# Patient Record
Sex: Female | Born: 2005
Health system: Southern US, Community
[De-identification: ages and names within clinical notes are randomized; demographics above are authoritative.]

## PROBLEM LIST (undated history)

## (undated) DIAGNOSIS — R51 Headache: Secondary | ICD-10-CM

## (undated) DIAGNOSIS — R519 Headache, unspecified: Secondary | ICD-10-CM

## (undated) HISTORY — PX: KNEE CARTILAGE SURGERY: SHX688

## (undated) HISTORY — PX: NO PAST SURGERIES: SHX2092

---
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
## 2023-01-28 ENCOUNTER — Encounter (HOSPITAL_COMMUNITY): Payer: Self-pay

## 2023-01-28 ENCOUNTER — Ambulatory Visit (HOSPITAL_COMMUNITY)
Admission: EM | Admit: 2023-01-28 | Discharge: 2023-01-28 | Disposition: A | Discharge status: Home / Self Care | Payer: Medicaid Other | Attending: Physician Assistant | Admitting: Physician Assistant

## 2023-01-28 DIAGNOSIS — N926 Irregular menstruation, unspecified: Secondary | ICD-10-CM | POA: Diagnosis present

## 2023-01-28 DIAGNOSIS — N946 Dysmenorrhea, unspecified: Secondary | ICD-10-CM

## 2023-01-28 LAB — CBC
HCT: 45 % (ref 36.0–46.0)
Hemoglobin: 14.8 g/dL (ref 12.0–15.0)
MCH: 29 pg (ref 26.0–34.0)
MCHC: 32.9 g/dL (ref 30.0–36.0)
MCV: 88.2 fL (ref 80.0–100.0)
Platelets: 328 10*3/uL (ref 150–400)
RBC: 5.1 MIL/uL (ref 3.87–5.11)
RDW: 13.9 % (ref 11.5–15.5)
WBC: 9.6 10*3/uL (ref 4.0–10.5)
nRBC: 0 % (ref 0.0–0.2)

## 2023-01-28 LAB — HCG, QUANTITATIVE, PREGNANCY: hCG, Beta Chain, Quant, S: <1 m[IU]/mL (ref <5)

## 2023-01-28 LAB — POCT URINALYSIS DIP (MANUAL ENTRY)
Bilirubin, UA: NEGATIVE
Glucose, UA: NEGATIVE mg/dL
Leukocytes, UA: NEGATIVE
Nitrite, UA: NEGATIVE
Protein Ur, POC: NEGATIVE mg/dL
Spec Grav, UA: >=1.03 — AB (ref 1.010–1.025)
Urobilinogen, UA: 0.2 E.U./dL
pH, UA: 5.5 (ref 5.0–8.0)

## 2023-01-28 LAB — POCT URINE PREGNANCY: Preg Test, Ur: NEGATIVE

## 2023-01-28 MED ORDER — IBUPROFEN 600 MG PO TABS: 600.0000 mg | ORAL_TABLET | Freq: Three times a day (TID) | ORAL | 0 refills | Status: DC | PRN

## 2023-01-28 NOTE — ED Triage Notes (Signed)
Onset 1-2 months Patient having nausea. 1-2 weeks ago 2 home pregnancy tests were positive. Last night onset of severe cramps. Onset this morning having vaginal bleeding and blood clots.   No known falls or injuries. No new medications. Not on birth control, stopped 3 months ago.

## 2023-01-28 NOTE — ED Provider Notes (Signed)
[facility]    CSN: [id number] Arrival date & time: 01/28/23  1646      History   Chief Complaint Chief Complaint  Patient presents with   Possible Pregnancy    Entered by patient    HPI Dawn Sullivan is a 17 y.o. female.   Patient presents today with a 24-hour history of lower abdominal cramping and vaginal bleeding.  Reports that she has a history of irregular menstrual cycles and her last menstrual cycle was October 2023.  She had been getting Depo-Provera beginning January of this year but was post to have her second dose in April and missed this.  She is no longer on any kind of contraception.  She did take a home pregnancy test a few weeks ago that were positive.  She has not established with OB/GYN or had any kind of additional testing.  She reports that this morning she developed significant lower abdominal cramping and dark discharge..  She denies any fever, nausea, vomiting, severe pelvic pain, abnormal vaginal discharge, specific concern for STI.  She denies any recent medication changes.  She reports that pain is rated 6 on a 0-10 pain scale, localized to lower abdomen, described as cramping, no aggravating alleviating factors identified.    Past Medical History:  Diagnosis Date   Headache     Patient Active Problem List   Diagnosis Date Noted   Patellar subluxation, right, subsequent encounter 08/12/2020    Past Surgical History:  Procedure Laterality Date   KNEE CARTILAGE SURGERY      OB History   No obstetric history on file.      Home Medications    Prior to Admission medications   Medication Sig Start Date End Date Taking? Authorizing Provider  ibuprofen (ADVIL) 600 MG tablet Take 1 tablet (600 mg total) by mouth every 8 (eight) hours as needed. 01/28/23  Yes Lynnita Somma, Noberto Retort, PA-C    Family History Family History  Problem Relation Age of Onset   Kidney disease Mother     Social History Social History   Tobacco Use   Smoking  status: Never   Smokeless tobacco: Never  Vaping Use   Vaping Use: Never used  Substance Use Topics   Alcohol use: No   Drug use: No     Allergies   Amoxicillin   Review of Systems Review of Systems  Constitutional:  Negative for activity change, appetite change, fatigue and fever.  Gastrointestinal:  Positive for abdominal pain. Negative for diarrhea and vomiting.  Genitourinary:  Positive for pelvic pain and vaginal bleeding. Negative for dysuria, frequency, menstrual problem, urgency, vaginal discharge and vaginal pain.     Physical Exam Triage Vital Signs ED Triage Vitals  Enc Vitals Group     BP 01/28/23 1706 118/79     Pulse Rate 01/28/23 1706 89     Resp 01/28/23 1706 18     Temp 01/28/23 1706 98.6 F (37 C)     Temp Source 01/28/23 1706 Oral     SpO2 01/28/23 1706 96 %     Weight 01/28/23 1706 240 lb (108.9 kg)     Height 01/28/23 1706 6\' 2"  (1.88 m)     Head Circumference --      Peak Flow --      Pain Score 01/28/23 1704 6     Pain Loc --      Pain Edu? --      Excl. in GC? --    No data  found.  Updated Vital Signs BP 118/79 (BP Location: Left Arm)   Pulse 89   Temp 98.6 F (37 C) (Oral)   Resp 18   Ht 6\' 2"  (1.88 m)   Wt 240 lb (108.9 kg)   LMP 05/16/2022 (Approximate)   SpO2 96%   BMI 30.81 kg/m   Visual Acuity Right Eye Distance:   Left Eye Distance:   Bilateral Distance:    Right Eye Near:   Left Eye Near:    Bilateral Near:     Physical Exam Vitals reviewed. Exam conducted with a chaperone present.  Constitutional:      General: She is awake. She is not in acute distress.    Appearance: Normal appearance. She is well-developed. She is not ill-appearing.     Comments: Very pleasant female appears stated age in no acute distress sitting comfortably in exam room  HENT:     Head: Normocephalic and atraumatic.  Cardiovascular:     Rate and Rhythm: Normal rate and regular rhythm.     Heart sounds: Normal heart sounds, S1 normal and  S2 normal. No murmur heard. Pulmonary:     Effort: Pulmonary effort is normal.     Breath sounds: Normal breath sounds. No wheezing, rhonchi or rales.     Comments: Clear to auscultation bilaterally Abdominal:     General: Bowel sounds are normal.     Palpations: Abdomen is soft.     Tenderness: There is abdominal tenderness in the suprapubic area. There is no right CVA tenderness, left CVA tenderness, guarding or rebound.  Genitourinary:    Labia:        Right: No rash or tenderness.        Left: No rash or tenderness.      Vagina: Bleeding present.     Cervix: Normal.     Uterus: Normal.      Adnexa: Right adnexa normal and left adnexa normal.     Comments: Percell Belt, CMA present as chaperone during exam.   Psychiatric:        Behavior: Behavior is cooperative.      UC Treatments / Results  Labs (all labs ordered are listed, but only abnormal results are displayed) Labs Reviewed  POCT URINALYSIS DIP (MANUAL ENTRY) - Abnormal; Notable for the following components:      Result Value   Ketones, POC UA trace (5) (*)    Spec Grav, UA >=1.030 (*)    Blood, UA large (*)    All other components within normal limits  CBC  HCG, QUANTITATIVE, PREGNANCY  POCT URINE PREGNANCY    EKG   Radiology No results found.  Procedures Procedures (including critical care time)  Medications Ordered in UC Medications - No data to display  Initial Impression / Assessment and Plan / UC Course  I have reviewed the triage vital signs and the nursing notes.  Pertinent labs & imaging results that were available during my care of the patient were reviewed by me and considered in my medical decision making (see chart for details).     Patient is well-appearing, afebrile, nontoxic, nontachycardic.  Urine pregnancy was negative in clinic today.  UA showed blood but was otherwise normal with no evidence of infection.  Blood is likely related to menstrual bleeding.  Attempted pelvic exam but  patient had difficulty tolerating speculum though I was able to visualize the cervix and posterior vaginal vault with obvious menstrual bleeding.  She did not have any significant tenderness on bimanual  exam.  STI swab was collected and is pending.  Discussed that her symptoms are likely related to starting her menstrual cycle.  She did request that we obtain blood work to rule out pregnancy and so blood hCG was obtained and is pending.  Discussed that she should not start ibuprofen that was prescribed until she receives the result and it is negative.  Assuming that it is negative she can start ibuprofen 600 mg to help with bleeding and cramping.  Recommend that she follow-up with OB/GYN for further evaluation and management.  We discussed that if she has any worsening symptoms including pelvic pain, fever, abdominal pain, nausea, vomiting, abnormal discharge, heavy menstrual bleeding she needs to be seen immediately.  Strict return precautions given.  All questions answered to patient satisfaction.  Final Clinical Impressions(s) / UC Diagnoses   Final diagnoses:  Dysmenorrhea  Irregular menstrual bleeding     Discharge Instructions      Your urine pregnancy test was negative here.  We are drawing your blood work to make sure that you are not pregnant as you requested.  Please do not start any medications until you receive this result.  Assuming that this is negative you can take ibuprofen 600 mg up to 3 times a day for pain relief.  Follow-up with OB/GYN.  If you have any abnormal symptoms including heavy bleeding, increasing pain, bleeding so heavily that you have to change her pad or tampon every hour for more than 3 to 4 hours, chest pain, shortness of breath you need to be seen immediately.     ED Prescriptions     Medication Sig Dispense Auth. Provider   ibuprofen (ADVIL) 600 MG tablet Take 1 tablet (600 mg total) by mouth every 8 (eight) hours as needed. 30 tablet Nyair Depaulo, Noberto Retort, PA-C       PDMP not reviewed this encounter.   Jeani Hawking, PA-C 01/28/23 1750

## 2023-01-28 NOTE — Discharge Instructions (Signed)
Your urine pregnancy test was negative here.  We are drawing your blood work to make sure that you are not pregnant as you requested.  Please do not start any medications until you receive this result.  Assuming that this is negative you can take ibuprofen 600 mg up to 3 times a day for pain relief.  Follow-up with OB/GYN.  If you have any abnormal symptoms including heavy bleeding, increasing pain, bleeding so heavily that you have to change her pad or tampon every hour for more than 3 to 4 hours, chest pain, shortness of breath you need to be seen immediately.

## 2023-05-13 ENCOUNTER — Ambulatory Visit (HOSPITAL_COMMUNITY)
Admission: EM | Admit: 2023-05-13 | Discharge: 2023-05-13 | Disposition: A | Discharge status: Home / Self Care | Payer: Medicaid Other | Attending: Emergency Medicine | Admitting: Emergency Medicine

## 2023-05-13 ENCOUNTER — Encounter (HOSPITAL_COMMUNITY): Payer: Self-pay

## 2023-05-13 ENCOUNTER — Ambulatory Visit (HOSPITAL_COMMUNITY): Payer: Medicaid Other

## 2023-05-13 DIAGNOSIS — R0981 Nasal congestion: Secondary | ICD-10-CM | POA: Diagnosis present

## 2023-05-13 DIAGNOSIS — Z20822 Contact with and (suspected) exposure to covid-19: Secondary | ICD-10-CM | POA: Insufficient documentation

## 2023-05-13 MED ORDER — GUAIFENESIN ER 600 MG PO TB12: 1200.0000 mg | ORAL_TABLET | Freq: Two times a day (BID) | ORAL | 0 refills | Status: AC

## 2023-05-13 NOTE — ED Triage Notes (Signed)
Pt states that she has some nasal congestion and sneezing. X2 days  Pt states that she was exposed to COVID.

## 2023-05-13 NOTE — Discharge Instructions (Addendum)
We have tested you for COVID-19 and your results will be available tomorrow in your MyChart.  Please ensure you are drinking at least 64 ounces of water daily in addition to the 1200 mg of Mucinex.  This will help loosen up your secretions.  You can also sleep with a humidifier.  This appears viral in nature, symptoms should improve over the next 3 to 5 days.    Return to clinic if you have any sinus pain, pressure, fevers or symptoms last beyond 7 to 10 days.

## 2023-05-13 NOTE — ED Provider Notes (Signed)
[facility]    CSN: [id number] Arrival date & time: 05/13/23  1728      History   Chief Complaint Chief Complaint  Patient presents with   Nasal Congestion    Nasal congestion x2 days    HPI Dawn Sullivan is a 17 y.o. female.   Patient presents to clinic complaining of nasal congestion for the past 2 days.  Her mother tested positive for COVID-19 in the emergency department last night.  Patient denies any other symptoms of cough, sore throat, nausea, emesis, abdominal pain or diarrhea. \  She has tried an OTC nasal spray, unsure of what kind.     The history is provided by the patient and medical records.    Past Medical History:  Diagnosis Date   Headache     Patient Active Problem List   Diagnosis Date Noted   Patellar subluxation, right, subsequent encounter 08/12/2020    Past Surgical History:  Procedure Laterality Date   KNEE CARTILAGE SURGERY      OB History   No obstetric history on file.      Home Medications    Prior to Admission medications   Medication Sig Start Date End Date Taking? Authorizing Provider  DEPO-PROVERA 150 MG/ML SUSY SMARTSIG:150 Milligram(s) IM 04/08/23  Yes [provider]  guaiFENesin (MUCINEX) 600 MG 12 hr tablet Take 2 tablets (1,200 mg total) by mouth 2 (two) times daily for 7 days. 05/13/23 05/20/23 Yes Rinaldo Ratel, [place] N, FNP  ibuprofen (ADVIL) 600 MG tablet Take 1 tablet (600 mg total) by mouth every 8 (eight) hours as needed. 01/28/23   Raspet, Noberto Retort, PA-C    Family History Family History  Problem Relation Age of Onset   Kidney disease Mother     Social History Social History   Tobacco Use   Smoking status: Never   Smokeless tobacco: Never  Vaping Use   Vaping status: Never Used  Substance Use Topics   Alcohol use: No   Drug use: No     Allergies   Amoxicillin   Review of Systems Review of Systems  Constitutional:  Negative for appetite change and fever.  HENT:  Positive for  congestion, postnasal drip, rhinorrhea and sinus pressure. Negative for sore throat.   Respiratory:  Negative for cough and shortness of breath.   Gastrointestinal:  Negative for abdominal pain, diarrhea, nausea and vomiting.     Physical Exam Triage Vital Signs ED Triage Vitals  Encounter Vitals Group     BP 05/13/23 1758 138/89     Systolic BP Percentile --      Diastolic BP Percentile --      Pulse Rate 05/13/23 1758 77     Resp 05/13/23 1758 18     Temp 05/13/23 1758 98 F (36.7 C)     Temp Source 05/13/23 1758 Oral     SpO2 05/13/23 1758 97 %     Weight --      Height 05/13/23 1757 6\' 2"  (1.88 m)     Head Circumference --      Peak Flow --      Pain Score 05/13/23 1757 0     Pain Loc --      Pain Education --      Exclude from Growth Chart --    No data found.  Updated Vital Signs BP 138/89 (BP Location: Right Arm)   Pulse 77   Temp 98 F (36.7 C) (Oral)   Resp 18  Ht 6\' 2"  (1.88 m)   SpO2 97%   BMI 30.81 kg/m   Visual Acuity Right Eye Distance:   Left Eye Distance:   Bilateral Distance:    Right Eye Near:   Left Eye Near:    Bilateral Near:     Physical Exam Vitals and nursing note reviewed.  Constitutional:      Appearance: Normal appearance.  HENT:     Head: Normocephalic and atraumatic.     Right Ear: External ear normal.     Left Ear: External ear normal.     Nose: Congestion and rhinorrhea present.     Mouth/Throat:     Mouth: Mucous membranes are moist.     Pharynx: Posterior oropharyngeal erythema present.  Eyes:     Conjunctiva/sclera: Conjunctivae normal.  Cardiovascular:     Rate and Rhythm: Normal rate and regular rhythm.     Heart sounds: Normal heart sounds. No murmur heard. Pulmonary:     Effort: Pulmonary effort is normal. No respiratory distress.     Breath sounds: Normal breath sounds.  Musculoskeletal:        General: Normal range of motion.  Skin:    General: Skin is warm and dry.  Neurological:     General: No focal  deficit present.     Mental Status: She is alert and oriented to person, place, and time.  Psychiatric:        Mood and Affect: Mood normal.        Behavior: Behavior normal.      UC Treatments / Results  Labs (all labs ordered are listed, but only abnormal results are displayed) Labs Reviewed  SARS CORONAVIRUS 2 (TAT 6-24 HRS)    EKG   Radiology No results found.  Procedures Procedures (including critical care time)  Medications Ordered in UC Medications - No data to display  Initial Impression / Assessment and Plan / UC Course  I have reviewed the triage vital signs and the nursing notes.  Pertinent labs & imaging results that were available during my care of the patient were reviewed by me and considered in my medical decision making (see chart for details).  Vitals and triage reviewed, patient is hemodynamically stable.  Lungs are vesicular, heart with regular rate and rhythm.  Rhinorrhea, postnasal drip and posterior pharynx erythema present on exam.  Suspect viral etiology, COVID-19 testing obtained.  Symptomatic management discussed.  Plan of care, follow-up care and return precautions given, no questions at this time.     Final Clinical Impressions(s) / UC Diagnoses   Final diagnoses:  Nasal congestion  Exposure to COVID-19 virus     Discharge Instructions      We have tested you for COVID-19 and your results will be available tomorrow in your MyChart.  Please ensure you are drinking at least 64 ounces of water daily in addition to the 1200 mg of Mucinex.  This will help loosen up your secretions.  You can also sleep with a humidifier.  This appears viral in nature, symptoms should improve over the next 3 to 5 days.    Return to clinic if you have any sinus pain, pressure, fevers or symptoms last beyond 7 to 10 days.      ED Prescriptions     Medication Sig Dispense Auth. Provider   guaiFENesin (MUCINEX) 600 MG 12 hr tablet Take 2 tablets (1,200 mg  total) by mouth 2 (two) times daily for 7 days. 28 tablet Cloy Cozzens, [place] N, [place]  PDMP not reviewed this encounter.   Sahvanna Mcmanigal, [place] N, [place] 05/13/23 [phone]

## 2023-05-14 LAB — SARS CORONAVIRUS 2 (TAT 6-24 HRS): SARS Coronavirus 2: POSITIVE — AB

## 2023-06-16 ENCOUNTER — Ambulatory Visit (HOSPITAL_COMMUNITY)
Admission: EM | Admit: 2023-06-16 | Discharge: 2023-06-16 | Disposition: A | Discharge status: Home / Self Care | Payer: Medicaid Other | Attending: Internal Medicine | Admitting: Internal Medicine

## 2023-06-16 ENCOUNTER — Encounter (HOSPITAL_COMMUNITY): Payer: Self-pay | Admitting: Emergency Medicine

## 2023-06-16 DIAGNOSIS — N898 Other specified noninflammatory disorders of vagina: Secondary | ICD-10-CM | POA: Insufficient documentation

## 2023-06-16 NOTE — ED Triage Notes (Signed)
Patient c/o that she feels "raw" in her vaginal area x 2 days.  Patient denies any vaginal discharge, no concern for STI.  Patient thinks she may have either BV or yeast due to vaginal itching.

## 2023-06-16 NOTE — ED Provider Notes (Signed)
[facility]    CSN: [id number] Arrival date & time: 06/16/23  1129      History   Chief Complaint Chief Complaint  Patient presents with   Exposure to STD    HPI Cherica Verburg is a 17 y.o. female.   Patient presents to urgent care for evaluation of vaginal irritation that started approximately 2 days ago.  Reports significant vaginal itching without vaginal odor or vaginal discharge.  No urinary symptoms, fever, chills, nausea, vomiting, diarrhea, abdominal pain, dizziness, or flank/low back pain.  She has not seen any rash or lesions to the vaginal area and denies concern for STD.  She is sexually active with female partner with protection.  She has not changed any of her personal hygiene products recently and denies recent intake of steroid/antibiotics.  She is currently experiencing withdrawal bleed as she recently started oral contraception and states that the bleeding is very light/spotting.  Has not attempted use of any over-the-counter medications to help with symptoms PTA.   Exposure to STD    Past Medical History:  Diagnosis Date   Headache     Patient Active Problem List   Diagnosis Date Noted   Patellar subluxation, right, subsequent encounter 08/12/2020    Past Surgical History:  Procedure Laterality Date   KNEE CARTILAGE SURGERY      OB History   No obstetric history on file.      Home Medications    Prior to Admission medications   Medication Sig Start Date End Date Taking? Authorizing Provider  DEPO-PROVERA 150 MG/ML SUSY SMARTSIG:150 Milligram(s) IM 04/08/23  Yes [provider]  ibuprofen (ADVIL) 600 MG tablet Take 1 tablet (600 mg total) by mouth every 8 (eight) hours as needed. 01/28/23   Raspet, Noberto Retort, PA-C    Family History Family History  Problem Relation Age of Onset   Kidney disease Mother     Social History Social History   Tobacco Use   Smoking status: Never   Smokeless tobacco: Never  Vaping Use   Vaping  status: Never Used  Substance Use Topics   Alcohol use: No   Drug use: No     Allergies   Amoxicillin   Review of Systems Review of Systems Per HPI  Physical Exam Triage Vital Signs ED Triage Vitals  Encounter Vitals Group     BP 06/16/23 1235 111/76     Systolic BP Percentile --      Diastolic BP Percentile --      Pulse Rate 06/16/23 1235 73     Resp 06/16/23 1235 18     Temp 06/16/23 1235 98.9 F (37.2 C)     Temp Source 06/16/23 1235 Oral     SpO2 06/16/23 1235 97 %     Weight 06/16/23 1238 245 lb (111.1 kg)     Height 06/16/23 1238 6\' 2"  (1.88 m)     Head Circumference --      Peak Flow --      Pain Score 06/16/23 1238 0     Pain Loc --      Pain Education --      Exclude from Growth Chart --    No data found.  Updated Vital Signs BP 111/76 (BP Location: Right Arm)   Pulse 73   Temp 98.9 F (37.2 C) (Oral)   Resp 18   Ht 6\' 2"  (1.88 m)   Wt 245 lb (111.1 kg)   SpO2 97%   BMI 31.46 kg/m  Visual Acuity Right Eye Distance:   Left Eye Distance:   Bilateral Distance:    Right Eye Near:   Left Eye Near:    Bilateral Near:     Physical Exam Vitals and nursing note reviewed.  Constitutional:      Appearance: She is not ill-appearing or toxic-appearing.  HENT:     Head: Normocephalic and atraumatic.     Right Ear: Hearing and external ear normal.     Left Ear: Hearing and external ear normal.     Nose: Nose normal.     Mouth/Throat:     Lips: Pink.  Eyes:     General: Lids are normal. Vision grossly intact. Gaze aligned appropriately.     Extraocular Movements: Extraocular movements intact.     Conjunctiva/sclera: Conjunctivae normal.  Pulmonary:     Effort: Pulmonary effort is normal.  Genitourinary:    Comments: Deferred. Musculoskeletal:     Cervical back: Neck supple.  Skin:    General: Skin is warm and dry.     Capillary Refill: Capillary refill takes less than 2 seconds.     Findings: No rash.  Neurological:     General: No  focal deficit present.     Mental Status: She is alert and oriented to person, place, and time. Mental status is at baseline.     Cranial Nerves: No dysarthria or facial asymmetry.  Psychiatric:        Mood and Affect: Mood normal.        Speech: Speech normal.        Behavior: Behavior normal.        Thought Content: Thought content normal.        Judgment: Judgment normal.      UC Treatments / Results  Labs (all labs ordered are listed, but only abnormal results are displayed) Labs Reviewed  CERVICOVAGINAL ANCILLARY ONLY    EKG   Radiology No results found.  Procedures Procedures (including critical care time)  Medications Ordered in UC Medications - No data to display  Initial Impression / Assessment and Plan / UC Course  I have reviewed the triage vital signs and the nursing notes.  Pertinent labs & imaging results that were available during my care of the patient were reviewed by me and considered in my medical decision making (see chart for details).   Vaginal irritation BV and yeast labs pending, no concern for STI. Will wait to treat once labs return, patient is agreeable with this.  Discussed tips for BV/yeast prevention.  She declines exam today, low suspicion for herpetic lesions/folliculitis.  Counseled patient on potential for adverse effects with medications prescribed/recommended today, strict ER and return-to-clinic precautions discussed, patient verbalized understanding.    Final Clinical Impressions(s) / UC Diagnoses   Final diagnoses:  Vaginal irritation   Discharge Instructions   None    ED Prescriptions   None    PDMP not reviewed this encounter.   Carlisle Beers, [place] 06/16/23 1312

## 2023-06-17 LAB — CERVICOVAGINAL ANCILLARY ONLY
Bacterial Vaginitis (gardnerella): NEGATIVE
Candida Glabrata: NEGATIVE
Candida Vaginitis: POSITIVE — AB
Comment: NEGATIVE
Comment: NEGATIVE
Comment: NEGATIVE

## 2023-06-20 ENCOUNTER — Telehealth (HOSPITAL_COMMUNITY): Payer: Self-pay | Admitting: Family Medicine

## 2023-06-20 ENCOUNTER — Telehealth (HOSPITAL_COMMUNITY): Payer: Self-pay

## 2023-06-20 MED ORDER — FLUCONAZOLE 150 MG PO TABS: 150.0000 mg | ORAL_TABLET | ORAL | 0 refills | Status: DC | PRN

## 2023-06-20 NOTE — Telephone Encounter (Signed)
Diflucan sent to the pharmacy on file.  Patient called and saw her recent vaginal cytology results which was positive for Candida.

## 2023-06-20 NOTE — Telephone Encounter (Signed)
This RN returned pt's phone call at this time. Two pt identifiers utilized. Pt voiced she checked her MyChart and saw she was positive for Candida and calling to request medication be sent to the pharmacy. This RN spoke with Cala Bradford NP, NP to call medication into pt's preferred pharmacy. No other questions/concerns voiced at this time. Pt verbalized understanding.

## 2023-10-23 ENCOUNTER — Encounter (HOSPITAL_COMMUNITY): Payer: Self-pay

## 2023-10-23 ENCOUNTER — Ambulatory Visit (HOSPITAL_COMMUNITY)
Admission: EM | Admit: 2023-10-23 | Discharge: 2023-10-23 | Disposition: A | Discharge status: Home / Self Care | Attending: Physician Assistant | Admitting: Physician Assistant

## 2023-10-23 DIAGNOSIS — Z113 Encounter for screening for infections with a predominantly sexual mode of transmission: Secondary | ICD-10-CM | POA: Insufficient documentation

## 2023-10-23 DIAGNOSIS — R8281 Pyuria: Secondary | ICD-10-CM | POA: Insufficient documentation

## 2023-10-23 DIAGNOSIS — Z3202 Encounter for pregnancy test, result negative: Secondary | ICD-10-CM | POA: Diagnosis not present

## 2023-10-23 LAB — POCT URINALYSIS DIP (MANUAL ENTRY)
Bilirubin, UA: NEGATIVE
Glucose, UA: NEGATIVE mg/dL
Ketones, POC UA: NEGATIVE mg/dL
Nitrite, UA: NEGATIVE
Protein Ur, POC: NEGATIVE mg/dL
Spec Grav, UA: 1.02 (ref 1.010–1.025)
Urobilinogen, UA: 0.2 U/dL
pH, UA: 5.5 (ref 5.0–8.0)

## 2023-10-23 LAB — POCT URINE PREGNANCY: Preg Test, Ur: NEGATIVE

## 2023-10-23 LAB — HIV ANTIBODY (ROUTINE TESTING W REFLEX): HIV Screen 4th Generation wRfx: REACTIVE — AB

## 2023-10-23 NOTE — ED Provider Notes (Signed)
 [facility]    CSN: [id number] Arrival date & time: 10/23/23  1215      History   Chief Complaint Chief Complaint  Patient presents with   std check    HPI Dawn Sullivan is a 18 y.o. female.   HPI   Patient presents today for STD testing. She is sexually active with single female partner She denies current symptoms of vaginal discharge changes, vaginal bleeding,dysuria, fever, chills    Past Medical History:  Diagnosis Date   Headache     Patient Active Problem List   Diagnosis Date Noted   Patellar subluxation, right, subsequent encounter 08/12/2020    Past Surgical History:  Procedure Laterality Date   KNEE CARTILAGE SURGERY      OB History   No obstetric history on file.      Home Medications    Prior to Admission medications   Medication Sig Start Date End Date Taking? Authorizing Provider  DEPO-PROVERA 150 MG/ML SUSY SMARTSIG:150 Milligram(s) IM 04/08/23  Yes [provider]  fluconazole (DIFLUCAN) 150 MG tablet Take 1 tablet (150 mg total) by mouth every three (3) days as needed. Repeat if needed 06/20/23   Bing Neighbors, NP  ibuprofen (ADVIL) 600 MG tablet Take 1 tablet (600 mg total) by mouth every 8 (eight) hours as needed. 01/28/23   Raspet, Noberto Retort, PA-C    Family History Family History  Problem Relation Age of Onset   Kidney disease Mother     Social History Social History   Tobacco Use   Smoking status: Never   Smokeless tobacco: Never  Vaping Use   Vaping status: Never Used  Substance Use Topics   Alcohol use: No   Drug use: No     Allergies   Amoxicillin   Review of Systems Review of Systems  Constitutional:  Negative for chills and fever.  Genitourinary:  Negative for dysuria, flank pain, genital sores, hematuria, vaginal bleeding, vaginal discharge and vaginal pain.  Skin:  Negative for rash.     Physical Exam Triage Vital Signs ED Triage Vitals  Encounter Vitals Group     BP 10/23/23 1233  129/85     Systolic BP Percentile --      Diastolic BP Percentile --      Pulse --      Resp 10/23/23 1233 19     Temp 10/23/23 1233 98.4 F (36.9 C)     Temp Source 10/23/23 1233 Oral     SpO2 10/23/23 1233 97 %     Weight 10/23/23 1232 245 lb (111.1 kg)     Height 10/23/23 1232 6\' 2"  (1.88 m)     Head Circumference --      Peak Flow --      Pain Score 10/23/23 1232 0     Pain Loc --      Pain Education --      Exclude from Growth Chart --    No data found.  Updated Vital Signs BP 129/85 (BP Location: Left Arm)   Temp 98.4 F (36.9 C) (Oral)   Resp 19   Ht 6\' 2"  (1.88 m)   Wt 245 lb (111.1 kg)   SpO2 97%   BMI 31.46 kg/m   Visual Acuity Right Eye Distance:   Left Eye Distance:   Bilateral Distance:    Right Eye Near:   Left Eye Near:    Bilateral Near:     Physical Exam Vitals reviewed.  Constitutional:  General: She is awake.     Appearance: Normal appearance. She is well-developed and well-groomed.  HENT:     Head: Normocephalic and atraumatic.  Eyes:     General: Lids are normal. Gaze aligned appropriately.     Extraocular Movements: Extraocular movements intact.     Conjunctiva/sclera: Conjunctivae normal.  Pulmonary:     Effort: Pulmonary effort is normal.  Neurological:     General: No focal deficit present.     Mental Status: She is alert and oriented to person, place, and time.     GCS: GCS eye subscore is 4. GCS verbal subscore is 5. GCS motor subscore is 6.     Cranial Nerves: No cranial nerve deficit, dysarthria or facial asymmetry.  Psychiatric:        Attention and Perception: Attention and perception normal.        Mood and Affect: Mood and affect normal.        Speech: Speech normal.        Behavior: Behavior normal. Behavior is cooperative.        Thought Content: Thought content normal.        Judgment: Judgment normal.      UC Treatments / Results  Labs (all labs ordered are listed, but only abnormal results are  displayed) Labs Reviewed  POCT URINALYSIS DIP (MANUAL ENTRY) - Abnormal; Notable for the following components:      Result Value   Blood, UA trace-intact (*)    Leukocytes, UA Small (1+) (*)    All other components within normal limits  URINE CULTURE  HIV ANTIBODY (ROUTINE TESTING W REFLEX)  RPR  POCT URINE PREGNANCY  CERVICOVAGINAL ANCILLARY ONLY    EKG   Radiology No results found.  Procedures Procedures (including critical care time)  Medications Ordered in UC Medications - No data to display  Initial Impression / Assessment and Plan / UC Course  I have reviewed the triage vital signs and the nursing notes.  Pertinent labs & imaging results that were available during my care of the patient were reviewed by me and considered in my medical decision making (see chart for details).      Final Clinical Impressions(s) / UC Diagnoses   Final diagnoses:  Screening for STD (sexually transmitted disease)  Pyuria   Patient presents today requesting STD testing.  She denies symptoms of fever, chills, abdominal pain, dysuria, changes to vaginal discharge, vaginal bleeding, pain, genital sores, rashes in the genital region.  Reviewed with her that her urine pregnancy test was negative but urine dip did show trace amount of blood and small leukocytes.  Since she denies current symptoms I am concerned these may be from contamination so reviewed with her that I will send off a sample for culture for definitive rule out.  If antibiotic is indicated by culture results they will be sent in to the pharmacy on file.  Reviewed that cervicovaginal swab results and RPR, HIV testing results will be available in the next few days and she will be contacted with results.  If medications are indicated they will be sent in per protocol.  Recommend condom or other barrier method for protection against STDs.  Recommend refraining from sexual activity until test results are negative or she has completed an  appropriate medication regimen based on results.  Follow-up as needed.    Discharge Instructions      We will keep you updated with the results of your testing.  If any medications are  indicated based on those results they will be sent in to the pharmacy that we have on file.  Please monitor your MyChart and be aware that you may receive a phone call with testing results in the next few days.     ED Prescriptions   None    PDMP not reviewed this encounter.   Kariyah Baugh, Oswaldo Conroy, PA-C 10/23/23 1328

## 2023-10-23 NOTE — Discharge Instructions (Addendum)
 We will keep you updated with the results of your testing.  If any medications are indicated based on those results they will be sent in to the pharmacy that we have on file.  Please monitor your MyChart and be aware that you may receive a phone call with testing results in the next few days.  At this time I recommend that you refrain from sexual activity until your test results are negative.  Please use a condom or another barrier method to prevent STDs and pregnancy

## 2023-10-23 NOTE — ED Triage Notes (Signed)
 Pt states that she wanted to be checked for std. Pt denies any symptoms or exposure.

## 2023-10-24 ENCOUNTER — Telehealth (HOSPITAL_COMMUNITY): Payer: Self-pay

## 2023-10-24 LAB — URINE CULTURE: Culture: NO GROWTH

## 2023-10-24 LAB — CERVICOVAGINAL ANCILLARY ONLY
Bacterial Vaginitis (gardnerella): POSITIVE — AB
Candida Glabrata: NEGATIVE
Candida Vaginitis: NEGATIVE
Chlamydia: NEGATIVE
Comment: NEGATIVE
Comment: NEGATIVE
Comment: NEGATIVE
Comment: NEGATIVE
Comment: NEGATIVE
Comment: NORMAL
Neisseria Gonorrhea: NEGATIVE
Trichomonas: NEGATIVE

## 2023-10-24 LAB — RPR: RPR Ser Ql: NONREACTIVE

## 2023-10-24 NOTE — Telephone Encounter (Signed)
 Pt called requesting lab results, pt advised to call back tomorrow because we are still awaiting more labs to be resulted. Pt wanted to let us know that Fluconazole did not work for her when prev prescribed.

## 2023-10-28 MED ORDER — CLINDAMYCIN PHOSPHATE 2 % VA CREA: 1.0000 | TOPICAL_CREAM | Freq: Every day | VAGINAL | 0 refills | Status: AC

## 2023-10-28 NOTE — Telephone Encounter (Signed)
 Noted. Patient was informed previously via phone that RX would be sent to her preferred pharmacy. Nothing further needed at this time. Encounter signed and closed.

## 2023-10-28 NOTE — Telephone Encounter (Signed)
 Rx for clindamycin vag cream sent to pharmacy

## 2023-10-28 NOTE — Telephone Encounter (Signed)
 Patient calling in and states that she is currently having vaginal symptoms. Saw on mychart that she tested positive for BV from her 10/24/23 visit.   Patient states she is allergic to Amoxicillin and that the last time she took Flagyl I did not help her. Requesting a different type of antibiotic be called in for her to [facility].   Patient informed message routed to the provider on call and RX will be sent in the next hour. Patient verbalized understanding.

## 2023-10-29 ENCOUNTER — Emergency Department (HOSPITAL_COMMUNITY)
Admission: EM | Admit: 2023-10-29 | Discharge: 2023-10-29 | Disposition: A | Discharge status: Home / Self Care | Attending: Emergency Medicine | Admitting: Emergency Medicine

## 2023-10-29 ENCOUNTER — Encounter (HOSPITAL_COMMUNITY): Payer: Self-pay | Admitting: Emergency Medicine

## 2023-10-29 ENCOUNTER — Other Ambulatory Visit: Payer: Self-pay

## 2023-10-29 DIAGNOSIS — R519 Headache, unspecified: Secondary | ICD-10-CM | POA: Diagnosis present

## 2023-10-29 DIAGNOSIS — G43809 Other migraine, not intractable, without status migrainosus: Secondary | ICD-10-CM | POA: Diagnosis not present

## 2023-10-29 DIAGNOSIS — B9689 Other specified bacterial agents as the cause of diseases classified elsewhere: Secondary | ICD-10-CM | POA: Insufficient documentation

## 2023-10-29 DIAGNOSIS — N760 Acute vaginitis: Secondary | ICD-10-CM | POA: Insufficient documentation

## 2023-10-29 LAB — URINALYSIS, ROUTINE W REFLEX MICROSCOPIC
Bilirubin Urine: NEGATIVE
Glucose, UA: NEGATIVE mg/dL
Hgb urine dipstick: NEGATIVE
Ketones, ur: NEGATIVE mg/dL
Nitrite: NEGATIVE
Protein, ur: NEGATIVE mg/dL
Specific Gravity, Urine: 1.013 (ref 1.005–1.030)
pH: 6 (ref 5.0–8.0)

## 2023-10-29 LAB — HIV ANTIBODY (ROUTINE TESTING W REFLEX): HIV Screen 4th Generation wRfx: REACTIVE — AB

## 2023-10-29 LAB — HCG, SERUM, QUALITATIVE: Preg, Serum: NEGATIVE

## 2023-10-29 MED ORDER — KETOROLAC TROMETHAMINE 15 MG/ML IJ SOLN
15.0000 mg | Freq: Once | INTRAMUSCULAR | Status: AC
  Administered 2023-10-29: 15 mg via INTRAVENOUS

## 2023-10-29 MED ORDER — PROCHLORPERAZINE EDISYLATE 10 MG/2ML IJ SOLN
10.0000 mg | INTRAMUSCULAR | Status: AC
  Administered 2023-10-29: 10 mg via INTRAVENOUS
  Filled 2023-10-29: qty 2

## 2023-10-29 MED ORDER — METRONIDAZOLE 500 MG PO TABS
500.0000 mg | ORAL_TABLET | Freq: Once | ORAL | Status: AC
  Administered 2023-10-29: 500 mg via ORAL
  Filled 2023-10-29: qty 1

## 2023-10-29 MED ORDER — ACETAMINOPHEN 500 MG PO TABS
1000.0000 mg | ORAL_TABLET | Freq: Once | ORAL | Status: AC
  Administered 2023-10-29: 1000 mg via ORAL
  Filled 2023-10-29: qty 2

## 2023-10-29 MED ORDER — DIPHENHYDRAMINE HCL 50 MG/ML IJ SOLN
25.0000 mg | Freq: Once | INTRAMUSCULAR | Status: AC
  Administered 2023-10-29: 25 mg via INTRAVENOUS
  Filled 2023-10-29: qty 1

## 2023-10-29 MED ORDER — KETOROLAC TROMETHAMINE 15 MG/ML IJ SOLN
15.0000 mg | Freq: Once | INTRAMUSCULAR | Status: DC
  Filled 2023-10-29: qty 1

## 2023-10-29 MED ORDER — METRONIDAZOLE 500 MG PO TABS: 500.0000 mg | ORAL_TABLET | Freq: Two times a day (BID) | ORAL | 0 refills | Status: DC

## 2023-10-29 NOTE — ED Provider Notes (Signed)
 [place] [facility] AT [facility] Provider Note   CSN: [id number] Arrival date & time: 10/29/23  1410     History  Chief Complaint  Patient presents with   Migraine   Exposure to STD    Dawn Sullivan is a 18 y.o. female with history of migraines.  The patient presents to the ED for evaluation of migraine.  States that she has had a migraine for the last week unrelieved by ibuprofen.  Reports history of migraines.  States this migraine feels like a typical migraine just lasting longer than usual.  Denies any neck pain, neck stiffness, fevers, photophobia, one-sided weakness or numbness.  Denies ever seeing neurology.  Patient also here requesting STI testing.  The patient had STI testing done on 3/9 at urgent care.  At this visit, patient had a reactive rapid HIV screen.  Confirmation testing is still pending at this time.  She states that she never received antibiotic.  Patient apparently had a positive BV test.  She is endorsing vaginal discharge but denies dysuria, flank pain, abdominal pain, nausea, vomiting.   Migraine Associated symptoms include headaches. Pertinent negatives include no abdominal pain.  Exposure to STD Associated symptoms include headaches. Pertinent negatives include no abdominal pain.       Home Medications Prior to Admission medications   Medication Sig Start Date End Date Taking? Authorizing Provider  metroNIDAZOLE (FLAGYL) 500 MG tablet Take 1 tablet (500 mg total) by mouth 2 (two) times daily. 10/29/23  Yes Al Decant, PA-C  clindamycin (CLEOCIN) 2 % vaginal cream Place 1 Applicatorful vaginally at bedtime for 7 days. 10/28/23 11/04/23  Zenia Resides, MD  DEPO-PROVERA 150 MG/ML SUSY SMARTSIG:150 Milligram(s) IM 04/08/23   [provider]  fluconazole (DIFLUCAN) 150 MG tablet Take 1 tablet (150 mg total) by mouth every three (3) days as needed. Repeat if needed 06/20/23   Bing Neighbors, NP  ibuprofen (ADVIL)  600 MG tablet Take 1 tablet (600 mg total) by mouth every 8 (eight) hours as needed. 01/28/23   Raspet, Noberto Retort, PA-C      Allergies    Amoxicillin    Review of Systems   Review of Systems  Constitutional:  Negative for fever.  Eyes:  Negative for visual disturbance.  Gastrointestinal:  Negative for abdominal pain, nausea and vomiting.  Genitourinary:  Positive for vaginal discharge. Negative for dysuria.  Musculoskeletal:  Negative for neck pain and neck stiffness.  Neurological:  Positive for headaches.  All other systems reviewed and are negative.   Physical Exam Updated Vital Signs BP (!) 125/101 (BP Location: Right Arm)   Pulse 98   Temp 98.8 F (37.1 C)   Resp 19   Ht 6\' 2"  (1.88 m)   Wt 111.1 kg   LMP  (LMP Unknown)   SpO2 99%   BMI 31.46 kg/m  Physical Exam Vitals and nursing note reviewed.  Constitutional:      General: She is not in acute distress.    Appearance: She is well-developed.  HENT:     Head: Normocephalic and atraumatic.  Eyes:     Conjunctiva/sclera: Conjunctivae normal.  Cardiovascular:     Rate and Rhythm: Normal rate and regular rhythm.     Heart sounds: No murmur heard. Pulmonary:     Effort: Pulmonary effort is normal. No respiratory distress.     Breath sounds: Normal breath sounds.  Abdominal:     Palpations: Abdomen is soft.     Tenderness:  There is no abdominal tenderness.  Musculoskeletal:        General: No swelling.     Cervical back: Neck supple.  Skin:    General: Skin is warm and dry.     Capillary Refill: Capillary refill takes less than 2 seconds.  Neurological:     General: No focal deficit present.     Mental Status: She is alert and oriented to person, place, and time.     GCS: GCS eye subscore is 4. GCS verbal subscore is 5. GCS motor subscore is 6.     Cranial Nerves: Cranial nerves 2-12 are intact. No cranial nerve deficit.     Sensory: Sensation is intact. No sensory deficit.     Motor: Motor function is intact.  No weakness.     Coordination: Coordination is intact. Heel to Shin Test normal.  Psychiatric:        Mood and Affect: Mood normal.     ED Results / Procedures / Treatments   Labs (all labs ordered are listed, but only abnormal results are displayed) Labs Reviewed  URINALYSIS, ROUTINE W REFLEX MICROSCOPIC - Abnormal; Notable for the following components:      Result Value   APPearance HAZY (*)    Leukocytes,Ua LARGE (*)    Bacteria, UA FEW (*)    All other components within normal limits  WET PREP, GENITAL  HCG, SERUM, QUALITATIVE  HIV ANTIBODY (ROUTINE TESTING W REFLEX)  RPR    EKG None  Radiology No results found.  Procedures Procedures   Medications Ordered in ED Medications  acetaminophen (TYLENOL) tablet 1,000 mg (1,000 mg Oral Given 10/29/23 1537)  prochlorperazine (COMPAZINE) injection 10 mg (10 mg Intravenous Given 10/29/23 1711)  diphenhydrAMINE (BENADRYL) injection 25 mg (25 mg Intravenous Given 10/29/23 1710)  ketorolac (TORADOL) 15 MG/ML injection 15 mg (15 mg Intravenous Given 10/29/23 1711)  metroNIDAZOLE (FLAGYL) tablet 500 mg (500 mg Oral Given 10/29/23 1730)    ED Course/ Medical Decision Making/ A&P  Medical Decision Making Amount and/or Complexity of Data Reviewed Labs: ordered.  Risk OTC drugs. Prescription drug management.   18 year old female presents for evaluation.  Please see HPI for further details.  On examination patient is afebrile and nontachycardic.  Lung sounds are clear bilaterally, not hypoxic.  Abdomen soft and compressible.  Neurological examinations at baseline without focal neurodeficits.  CN III through XII intact.  Intact.  Nose and heel-to-shin.  No pronator drift.  No slurred speech.  Equal grip strength to upper extremities bilaterally.  Equal strength bilateral lower extremities.  Patient apparently requested STI testing up in triage.  Genital wet prep, RPR, HIV antibody testing, urinalysis was sent off.  Will not send for  the patient, I reviewed the patient chart.  The patient had STI testing done 6 days ago at urgent care.  She had a reactive HIV rapid test.  Confirmation testing is pending.  Her wet prep showed BV.  Her GC/chlamydia screening was negative.  Her RPR was also negative.  Due to this, we will not repeat the test today.  Patient was given initial dose of metronidazole.  Will send metronidazole to patient pharmacy.  She has been advised that she cannot drink alcohol taking this.  Patient headache, patient received migraine cocktail.  Her neurological examination was at baseline without any focal neurodeficits.  No imaging indications at this time.  After migraine cocktail, patient reports her migraine has completely resolved.  Will send her home at this time.  Will  have her follow-up with PCP.  [facility]  All questions answered to the patient satisfaction.  Stable to discharge home.   Final Clinical Impression(s) / ED Diagnoses Final diagnoses:  Other migraine without status migrainosus, not intractable  Bacterial vaginosis    Rx / DC Orders ED Discharge Orders          Ordered    metroNIDAZOLE (FLAGYL) 500 MG tablet  2 times daily        10/29/23 1800              Clent Ridges 10/29/23 1801    Benjiman Core, MD 10/29/23 2148

## 2023-10-29 NOTE — Discharge Instructions (Signed)
 It was a pleasure taking part in your care.  As discussed, you will need to take 7 days of metronidazole twice daily for your BV.  Please follow-up on the results of your confirmatory testing on MyChart.  Please follow-up with your PCP for your migraines.  [facility] for all her needs concerns.  Return to ED with any new or worsening symptoms.

## 2023-10-29 NOTE — ED Triage Notes (Signed)
 Pt reports severe headache for the last week. Pt reports has taken ibuprofen with no relief. Denies recent fevers, sick contacts. Pt awake,alert, no distress noted. VSS. Pt also requesting STD testing.

## 2023-10-30 LAB — RPR: RPR Ser Ql: NONREACTIVE

## 2023-11-05 LAB — HIV-1/HIV-2 QUALITATIVE RNA
Final Interpretation: NEGATIVE
HIV-1 RNA, Qualitative: NONREACTIVE
HIV-2 RNA, Qualitative: NONREACTIVE

## 2023-11-05 LAB — HIV-1/2 AB - DIFFERENTIATION
HIV 1 Ab: NONREACTIVE
HIV 2 Ab: NONREACTIVE
Note: NEGATIVE

## 2023-11-06 LAB — HIV-1/HIV-2 QUALITATIVE RNA

## 2023-11-06 LAB — HIV-1/2 AB - DIFFERENTIATION
HIV 1 Ab: NONREACTIVE
HIV 2 Ab: NONREACTIVE
Note: NEGATIVE

## 2024-01-11 ENCOUNTER — Encounter (HOSPITAL_COMMUNITY): Payer: Self-pay | Admitting: Emergency Medicine

## 2024-01-11 ENCOUNTER — Ambulatory Visit (HOSPITAL_COMMUNITY)
Admission: EM | Admit: 2024-01-11 | Discharge: 2024-01-11 | Disposition: A | Discharge status: Home / Self Care | Attending: Emergency Medicine | Admitting: Emergency Medicine

## 2024-01-11 ENCOUNTER — Other Ambulatory Visit: Payer: Self-pay

## 2024-01-11 DIAGNOSIS — M25561 Pain in right knee: Secondary | ICD-10-CM | POA: Insufficient documentation

## 2024-01-11 DIAGNOSIS — Z3202 Encounter for pregnancy test, result negative: Secondary | ICD-10-CM

## 2024-01-11 DIAGNOSIS — J029 Acute pharyngitis, unspecified: Secondary | ICD-10-CM | POA: Diagnosis not present

## 2024-01-11 DIAGNOSIS — G8929 Other chronic pain: Secondary | ICD-10-CM | POA: Diagnosis not present

## 2024-01-11 LAB — POCT URINE PREGNANCY: Preg Test, Ur: NEGATIVE

## 2024-01-11 LAB — POCT RAPID STREP A (OFFICE): Rapid Strep A Screen: NEGATIVE

## 2024-01-11 MED ORDER — NAPROXEN 500 MG PO TABS: 500.0000 mg | ORAL_TABLET | Freq: Two times a day (BID) | ORAL | 0 refills | Status: DC

## 2024-01-11 NOTE — ED Provider Notes (Signed)
 [facility]    CSN: [id number] Arrival date & time: 01/11/24  1820      History   Chief Complaint Chief Complaint  Patient presents with   Sore Throat   Knee Pain    HPI Dawn Sullivan is a 18 y.o. female.   Patient presents with sore throat that began last night.  Patient states that she has had a little runny nose related to allergies over the last few weeks.  But denies cough or significant congestion.  Patient also denies shortness of breath, chest pain, nausea, vomiting, diarrhea, abdominal pain.  Patient also reports some pain and stiffness to her right knee x 1 to 2 weeks.  Patient has a history of surgery to her right knee.  Patient states she has not seen orthopedic doctor in a long time regarding her knee.  Patient denies any recent falls or new injury.  Patient denies difficulty walking due to the pain.  Patient denies taking any medication for her symptoms.  Patient is unsure of her LMP.  Patient states that she received her last Depo injection in February 2025.  Patient reports she has been sexually active since then.  The history is provided by the patient and medical records.  Sore Throat  Knee Pain   Past Medical History:  Diagnosis Date   Headache     Patient Active Problem List   Diagnosis Date Noted   Patellar subluxation, right, subsequent encounter 08/12/2020    Past Surgical History:  Procedure Laterality Date   KNEE CARTILAGE SURGERY      OB History   No obstetric history on file.      Home Medications    Prior to Admission medications   Medication Sig Start Date End Date Taking? Authorizing Provider  naproxen  (NAPROSYN ) 500 MG tablet Take 1 tablet (500 mg total) by mouth 2 (two) times daily. 01/11/24  Yes Rosevelt Constable, Terri Rorrer A, NP  ibuprofen  (ADVIL ) 600 MG tablet Take 1 tablet (600 mg total) by mouth every 8 (eight) hours as needed. 01/28/23   Raspet, Betsey Brow, PA-C    Family History Family History  Problem Relation Age of Onset    Kidney disease Mother     Social History Social History   Tobacco Use   Smoking status: Never   Smokeless tobacco: Never  Vaping Use   Vaping status: Never Used  Substance Use Topics   Alcohol use: No   Drug use: No     Allergies   Amoxicillin   Review of Systems Review of Systems  Per HPI  Physical Exam Triage Vital Signs ED Triage Vitals  Encounter Vitals Group     BP 01/11/24 1938 135/84     Systolic BP Percentile --      Diastolic BP Percentile --      Pulse Rate 01/11/24 1938 86     Resp 01/11/24 1938 18     Temp 01/11/24 1938 98.2 F (36.8 C)     Temp Source 01/11/24 1938 Oral     SpO2 01/11/24 1938 97 %     Weight --      Height --      Head Circumference --      Peak Flow --      Pain Score 01/11/24 1935 9     Pain Loc --      Pain Education --      Exclude from Growth Chart --    No data found.  Updated Vital Signs BP  135/84 (BP Location: Right Arm)   Pulse 86   Temp 98.2 F (36.8 C) (Oral)   Resp 18   LMP  (LMP Unknown) Comment: last depo was either february or march of 2025-has not had a period since then.  SpO2 97%   Visual Acuity Right Eye Distance:   Left Eye Distance:   Bilateral Distance:    Right Eye Near:   Left Eye Near:    Bilateral Near:     Physical Exam Vitals and nursing note reviewed.  Constitutional:      General: She is awake. She is not in acute distress.    Appearance: Normal appearance. She is well-developed and well-groomed. She is not ill-appearing.  HENT:     Right Ear: Ear canal and external ear normal. Tympanic membrane is erythematous.     Left Ear: Ear canal and external ear normal. Tympanic membrane is erythematous.     Nose: Congestion and rhinorrhea present.     Mouth/Throat:     Mouth: Mucous membranes are moist.     Pharynx: Posterior oropharyngeal erythema and postnasal drip present.     Tonsils: No tonsillar exudate. 2+ on the right. 1+ on the left.  Cardiovascular:     Rate and Rhythm:  Normal rate and regular rhythm.  Pulmonary:     Effort: Pulmonary effort is normal.     Breath sounds: Normal breath sounds.  Musculoskeletal:     Right knee: No swelling, deformity or bony tenderness. Normal range of motion. No tenderness.  Skin:    General: Skin is warm and dry.  Neurological:     Mental Status: She is alert.  Psychiatric:        Behavior: Behavior is cooperative.      UC Treatments / Results  Labs (all labs ordered are listed, but only abnormal results are displayed) Labs Reviewed  CULTURE, GROUP A STREP [facility])  POCT RAPID STREP A (OFFICE)  POCT URINE PREGNANCY    EKG   Radiology No results found.  Procedures Procedures (including critical care time)  Medications Ordered in UC Medications - No data to display  Initial Impression / Assessment and Plan / UC Course  I have reviewed the triage vital signs and the nursing notes.  Pertinent labs & imaging results that were available during my care of the patient were reviewed by me and considered in my medical decision making (see chart for details).     Patient is well-appearing.  Vitals are stable.  Upon assessment congestion and rhinorrhea are present, mild erythema and PND noted to pharynx.  2+ tonsillar swelling noted on the right and 1+ tonsillar swelling noted on the left.  Bilateral TMs are mildly erythematous.  Right knee is nontender and without swelling or decreased range of motion.  Patient endorses some pain with ambulating intermittently.  Rapid strep was negative, will send culture to confirm no presence of strep pharyngitis.  Symptoms likely viral in nature.  Urine pregnancy was negative.  Prescribe naproxen  as needed for pain.  Recommended taking Tylenol  as needed for breakthrough pain.  Given orthopedic follow-up.  Discussed follow-up and return precautions. Final Clinical Impressions(s) / UC Diagnoses   Final diagnoses:  Viral pharyngitis  Chronic pain of right knee     Discharge  Instructions      Your pregnancy test was negative.  Your rapid strep test was negative, I will send a culture to confirm there is no presence of strep throat. I believe your symptoms are likely  viral in nature and therefore do not require an antibiotic at this time. I have prescribed naproxen  that you can take twice daily as needed for sore throat and the pain.  Do not take this with other NSAIDs including ibuprofen , Advil , Motrin , and Aleve . You can take 650 mg of Tylenol  as needed for breakthrough pain. You can alternate between ice and heat as needed for knee pain. I have attached an orthopedic doctor that you can follow-up with regarding concerns for your knee pain. Return here if symptoms persist or worsen.   ED Prescriptions     Medication Sig Dispense Auth. Provider   naproxen  (NAPROSYN ) 500 MG tablet Take 1 tablet (500 mg total) by mouth 2 (two) times daily. 30 tablet Levora Reas A, NP      PDMP not reviewed this encounter.   Levora Reas A, NP 01/11/24 2016

## 2024-01-11 NOTE — Discharge Instructions (Addendum)
 Your pregnancy test was negative.  Your rapid strep test was negative, I will send a culture to confirm there is no presence of strep throat. I believe your symptoms are likely viral in nature and therefore do not require an antibiotic at this time. I have prescribed naproxen  that you can take twice daily as needed for sore throat and the pain.  Do not take this with other NSAIDs including ibuprofen , Advil , Motrin , and Aleve . You can take 650 mg of Tylenol  as needed for breakthrough pain. You can alternate between ice and heat as needed for knee pain. I have attached an orthopedic doctor that you can follow-up with regarding concerns for your knee pain. Return here if symptoms persist or worsen.

## 2024-01-11 NOTE — ED Triage Notes (Signed)
 Sore throat started last night.  Patient has a runny nose related to allergies per patient.  Patient is clearing throat frequently.  Patient states it feels like something is in her throat.    Patient has a history of surgery in right knee.  Patient has noticed pain and stiffness in right knee 1-2 weeks ago.    Has not taken any medicine for either complaint

## 2024-01-14 LAB — CULTURE, GROUP A STREP (THRC)

## 2024-01-16 ENCOUNTER — Ambulatory Visit (HOSPITAL_COMMUNITY): Payer: Self-pay

## 2024-01-17 ENCOUNTER — Encounter (HOSPITAL_COMMUNITY): Payer: Self-pay | Admitting: *Deleted

## 2024-01-17 ENCOUNTER — Ambulatory Visit (HOSPITAL_COMMUNITY)
Admission: EM | Admit: 2024-01-17 | Discharge: 2024-01-17 | Disposition: A | Discharge status: Home / Self Care | Attending: Family Medicine | Admitting: Family Medicine

## 2024-01-17 DIAGNOSIS — J0100 Acute maxillary sinusitis, unspecified: Secondary | ICD-10-CM | POA: Insufficient documentation

## 2024-01-17 DIAGNOSIS — J4521 Mild intermittent asthma with (acute) exacerbation: Secondary | ICD-10-CM | POA: Insufficient documentation

## 2024-01-17 DIAGNOSIS — J029 Acute pharyngitis, unspecified: Secondary | ICD-10-CM | POA: Insufficient documentation

## 2024-01-17 LAB — POCT MONO SCREEN (KUC): Mono, POC: NEGATIVE

## 2024-01-17 LAB — POCT RAPID STREP A (OFFICE): Rapid Strep A Screen: NEGATIVE

## 2024-01-17 MED ORDER — DOXYCYCLINE HYCLATE 100 MG PO CAPS: 100.0000 mg | ORAL_CAPSULE | Freq: Two times a day (BID) | ORAL | 0 refills | Status: AC

## 2024-01-17 MED ORDER — ALBUTEROL SULFATE HFA 108 (90 BASE) MCG/ACT IN AERS: 2.0000 | INHALATION_SPRAY | RESPIRATORY_TRACT | 0 refills | Status: DC | PRN

## 2024-01-17 MED ORDER — PREDNISONE 20 MG PO TABS: 40.0000 mg | ORAL_TABLET | Freq: Every day | ORAL | 0 refills | Status: AC

## 2024-01-17 MED ORDER — BENZONATATE 100 MG PO CAPS: 100.0000 mg | ORAL_CAPSULE | Freq: Three times a day (TID) | ORAL | 0 refills | Status: DC | PRN

## 2024-01-17 MED ORDER — KETOROLAC TROMETHAMINE 30 MG/ML IJ SOLN
INTRAMUSCULAR | Status: AC
Start: 2024-01-17 — End: ?
  Filled 2024-01-17: qty 1

## 2024-01-17 MED ORDER — KETOROLAC TROMETHAMINE 30 MG/ML IJ SOLN
30.0000 mg | Freq: Once | INTRAMUSCULAR | Status: AC
  Administered 2024-01-17: 30 mg via INTRAMUSCULAR

## 2024-01-17 NOTE — ED Triage Notes (Signed)
 Pt states she was seen last week for sore throat and took the naproxen  without relief. She states She hasn't taken any meds today.She complains sore throat is worse now.

## 2024-01-17 NOTE — ED Provider Notes (Signed)
 [facility]    CSN: [id number] Arrival date & time: 01/17/24  1216      History   Chief Complaint Chief Complaint  Patient presents with   Cough   Nasal Congestion   Sore Throat    HPI Dawn Sullivan is a 18 y.o. female.    Cough Sore Throat  Here for continued sore throat and hoarseness and a little bit of cough.  Patient was seen here on May 28, at which time she had had a several day history of sore throat.  She developed hoarseness after she was seen here and the throat has felt worse in the last few days.  Rapid strep was negative at last visit, and the throat culture was also negative.    No fever and no vomiting.  Uncertain when last period was, but pregnancy test was negative last week.    She is allergic to amoxicillin which causes hives  She has felt some tightness in her chest.  Past Medical History:  Diagnosis Date   Headache     Patient Active Problem List   Diagnosis Date Noted   Patellar subluxation, right, subsequent encounter 08/12/2020    Past Surgical History:  Procedure Laterality Date   KNEE CARTILAGE SURGERY      OB History   No obstetric history on file.      Home Medications    Prior to Admission medications   Medication Sig Start Date End Date Taking? Authorizing Provider  albuterol (VENTOLIN HFA) 108 (90 Base) MCG/ACT inhaler Inhale 2 puffs into the lungs every 4 (four) hours as needed for wheezing or shortness of breath. 01/17/24  Yes Cedric Mcclaine K, MD  benzonatate (TESSALON) 100 MG capsule Take 1 capsule (100 mg total) by mouth 3 (three) times daily as needed for cough. 01/17/24  Yes Carleah Yablonski K, MD  doxycycline (VIBRAMYCIN) 100 MG capsule Take 1 capsule (100 mg total) by mouth 2 (two) times daily for 7 days. 01/17/24 01/24/24 Yes Temika Sutphin K, MD  predniSONE (DELTASONE) 20 MG tablet Take 2 tablets (40 mg total) by mouth daily with breakfast for 5 days. 01/17/24 01/22/24 Yes Ann Keto, MD     Family History Family History  Problem Relation Age of Onset   Kidney disease Mother     Social History Social History   Tobacco Use   Smoking status: Never   Smokeless tobacco: Never  Vaping Use   Vaping status: Never Used  Substance Use Topics   Alcohol use: No   Drug use: No     Allergies   Amoxicillin   Review of Systems Review of Systems  Respiratory:  Positive for cough.      Physical Exam Triage Vital Signs ED Triage Vitals  Encounter Vitals Group     BP 01/17/24 1336 129/88     Systolic BP Percentile --      Diastolic BP Percentile --      Pulse Rate 01/17/24 1336 90     Resp 01/17/24 1336 18     Temp 01/17/24 1336 98.8 F (37.1 C)     Temp Source 01/17/24 1336 Oral     SpO2 01/17/24 1336 98 %     Weight --      Height --      Head Circumference --      Peak Flow --      Pain Score 01/17/24 1335 7     Pain Loc --      Pain  Education --      Exclude from [employer] Chart --    No data found.  Updated Vital Signs BP 129/88 (BP Location: Left Arm)   Pulse 90   Temp 98.8 F (37.1 C) (Oral)   Resp 18   LMP  (LMP Unknown) Comment: last depo was either february or march of 2025-has not had a period since then.  SpO2 98%   Visual Acuity Right Eye Distance:   Left Eye Distance:   Bilateral Distance:    Right Eye Near:   Left Eye Near:    Bilateral Near:     Physical Exam Vitals reviewed.  Constitutional:      General: She is not in acute distress.    Appearance: She is not ill-appearing, toxic-appearing or diaphoretic.  HENT:     Right Ear: Tympanic membrane and ear canal normal.     Left Ear: Tympanic membrane and ear canal normal.     Nose: Congestion present.     Mouth/Throat:     Mouth: Mucous membranes are moist.     Comments: Posterior oropharynx is very erythematous with some yellow exudate in the posterior oropharynx wall.  No asymmetry and no tonsillar hypertrophy. Eyes:     Extraocular Movements: Extraocular movements  intact.     Conjunctiva/sclera: Conjunctivae normal.     Pupils: Pupils are equal, round, and reactive to light.  Cardiovascular:     Rate and Rhythm: Normal rate and regular rhythm.     Heart sounds: No murmur heard. Pulmonary:     Effort: No respiratory distress.     Breath sounds: No stridor. No wheezing, rhonchi or rales.  Musculoskeletal:     Cervical back: Neck supple.  Lymphadenopathy:     Cervical: No cervical adenopathy.  Skin:    Capillary Refill: Capillary refill takes less than 2 seconds.     Coloration: Skin is not jaundiced or pale.  Neurological:     General: No focal deficit present.     Mental Status: She is alert and oriented to person, place, and time.  Psychiatric:        Behavior: Behavior normal.      UC Treatments / Results  Labs (all labs ordered are listed, but only abnormal results are displayed) Labs Reviewed  POCT MONO SCREEN ([facility]) - Normal  POCT RAPID STREP A (OFFICE)  CYTOLOGY, (ORAL, ANAL, URETHRAL) ANCILLARY ONLY    EKG   Radiology No results found.  Procedures Procedures (including critical care time)  Medications Ordered in UC Medications  ketorolac  (TORADOL ) 30 MG/ML injection 30 mg (has no administration in time range)    Initial Impression / Assessment and Plan / UC Course  I have reviewed the triage vital signs and the nursing notes.  Pertinent labs & imaging results that were available during my care of the patient were reviewed by me and considered in my medical decision making (see chart for details).     Rapid strep test is negative.  Since she just had another negative recently and not going to send a culture.   monotest is negative   Throat swab for GC and chlamydia; staff will notify her if the test there is positive.  Since she has the chest tightness and it sounds like at least 2 weeks of congestion and other symptoms, I am going to do an antibiotic prescription for doxycycline along with some prednisone and  albuterol.  Toradol  was given here for pain. Final Clinical Impressions(s) / UC Diagnoses  Final diagnoses:  Sore throat  Acute maxillary sinusitis, recurrence not specified  Mild intermittent asthma with (acute) exacerbation     Discharge Instructions      The strep test was negative   the monotest was negative  Staff will notify you if there is anything positive on the last throat swab we did.  Albuterol inhaler--do 2 puffs every 4 hours as needed for shortness of breath or wheezing  Take prednisone 20 mg--2 daily for 5 days  Take benzonatate 100 mg, 1 tab every 8 hours as needed for cough.  Take doxycycline 100 mg --1 capsule 2 times daily for 7 days  You have been given a shot of Toradol  30 mg today.      ED Prescriptions     Medication Sig Dispense Auth. Provider   albuterol (VENTOLIN HFA) 108 (90 Base) MCG/ACT inhaler Inhale 2 puffs into the lungs every 4 (four) hours as needed for wheezing or shortness of breath. 1 each Gerell Fortson K, MD   doxycycline (VIBRAMYCIN) 100 MG capsule Take 1 capsule (100 mg total) by mouth 2 (two) times daily for 7 days. 14 capsule Hadlea Furuya K, MD   predniSONE (DELTASONE) 20 MG tablet Take 2 tablets (40 mg total) by mouth daily with breakfast for 5 days. 10 tablet Kion Huntsberry K, MD   benzonatate (TESSALON) 100 MG capsule Take 1 capsule (100 mg total) by mouth 3 (three) times daily as needed for cough. 21 capsule Dorion Petillo K, MD      PDMP not reviewed this encounter.   Ann Keto, MD 01/17/24 1535

## 2024-01-17 NOTE — Discharge Instructions (Addendum)
 The strep test was negative   the monotest was negative  Staff will notify you if there is anything positive on the last throat swab we did.  Albuterol inhaler--do 2 puffs every 4 hours as needed for shortness of breath or wheezing  Take prednisone 20 mg--2 daily for 5 days  Take benzonatate 100 mg, 1 tab every 8 hours as needed for cough.  Take doxycycline 100 mg --1 capsule 2 times daily for 7 days  You have been given a shot of Toradol  30 mg today.

## 2024-01-18 LAB — CYTOLOGY, (ORAL, ANAL, URETHRAL) ANCILLARY ONLY
Chlamydia: NEGATIVE
Comment: NEGATIVE
Comment: NORMAL
Neisseria Gonorrhea: NEGATIVE

## 2024-03-01 ENCOUNTER — Ambulatory Visit

## 2024-03-01 ENCOUNTER — Emergency Department (INDEPENDENT_AMBULATORY_CARE_PROVIDER_SITE_OTHER)
Admission: EM | Admit: 2024-03-01 | Discharge: 2024-03-01 | Disposition: A | Discharge status: Home / Self Care | Source: Home / Self Care

## 2024-03-01 ENCOUNTER — Emergency Department (HOSPITAL_COMMUNITY)
Admission: EM | Admit: 2024-03-01 | Discharge: 2024-03-01 | Disposition: A | Discharge status: Home / Self Care | Attending: Family Medicine | Admitting: Family Medicine

## 2024-03-01 ENCOUNTER — Other Ambulatory Visit: Payer: Self-pay

## 2024-03-01 ENCOUNTER — Emergency Department (HOSPITAL_COMMUNITY)

## 2024-03-01 ENCOUNTER — Emergency Department

## 2024-03-01 ENCOUNTER — Emergency Department (INDEPENDENT_AMBULATORY_CARE_PROVIDER_SITE_OTHER)

## 2024-03-01 VITALS — BP 122/88 | HR 92 | Temp 98.1°F | Resp 18

## 2024-03-01 DIAGNOSIS — M25572 Pain in left ankle and joints of left foot: Secondary | ICD-10-CM | POA: Diagnosis present

## 2024-03-01 DIAGNOSIS — M79672 Pain in left foot: Secondary | ICD-10-CM | POA: Diagnosis not present

## 2024-03-01 DIAGNOSIS — S99912A Unspecified injury of left ankle, initial encounter: Secondary | ICD-10-CM | POA: Diagnosis not present

## 2024-03-01 DIAGNOSIS — Z5329 Procedure and treatment not carried out because of patient's decision for other reasons: Secondary | ICD-10-CM | POA: Insufficient documentation

## 2024-03-01 NOTE — ED Triage Notes (Signed)
 Patient here from home reporting left ankle pain after twisting from curb.

## 2024-03-01 NOTE — ED Triage Notes (Signed)
 Pt reports she stepped wrong going down stairs and fell yesterday. C/o pain in left ankle. Ankle appears swollen

## 2024-03-01 NOTE — ED Provider Notes (Signed)
 [facility]    CSN: [id number] Arrival date & time: 03/01/24  1307      History   Chief Complaint Chief Complaint  Patient presents with   Ankle Pain    Entered by patient    HPI Dawn Sullivan is a 18 y.o. female.   Patient presents today for evaluation of left ankle injury that occurred yesterday.  She reports that she stepped wrong going downstairs and fell.  Today she has pain diffusely to the ankle with some pain also noted to proximal midfoot.  She does have some associated swelling as well.  She denies any numbness or tingling.  Weightbearing worsens pain  The history is provided by the patient.  Ankle Pain Associated symptoms: no fever     Past Medical History:  Diagnosis Date   Headache     Patient Active Problem List   Diagnosis Date Noted   Patellar subluxation, right, subsequent encounter 08/12/2020    Past Surgical History:  Procedure Laterality Date   KNEE CARTILAGE SURGERY      OB History   No obstetric history on file.      Home Medications    Prior to Admission medications   Medication Sig Start Date End Date Taking? Authorizing Provider  albuterol  (VENTOLIN  HFA) 108 (90 Base) MCG/ACT inhaler Inhale 2 puffs into the lungs every 4 (four) hours as needed for wheezing or shortness of breath. 01/17/24  Yes Vonna Sharlet POUR, MD  cetirizine  (ZYRTEC ) 10 MG tablet Take 10 mg by mouth daily.   Yes [provider]  benzonatate  (TESSALON ) 100 MG capsule Take 1 capsule (100 mg total) by mouth 3 (three) times daily as needed for cough. Patient not taking: Reported on 03/01/2024 01/17/24   Vonna Sharlet POUR, MD    Family History Family History  Problem Relation Age of Onset   Kidney disease Mother     Social History Social History   Tobacco Use   Smoking status: Never   Smokeless tobacco: Never  Vaping Use   Vaping status: Never Used  Substance Use Topics   Alcohol use: No   Drug use: No     Allergies    Amoxicillin   Review of Systems Review of Systems  Constitutional:  Negative for chills and fever.  Eyes:  Negative for discharge and redness.  Respiratory:  Negative for shortness of breath.   Gastrointestinal:  Negative for abdominal pain, nausea and vomiting.  Musculoskeletal:  Positive for arthralgias and joint swelling.  Neurological:  Negative for numbness.     Physical Exam Triage Vital Signs ED Triage Vitals  Encounter Vitals Group     BP      Girls Systolic BP Percentile      Girls Diastolic BP Percentile      Boys Systolic BP Percentile      Boys Diastolic BP Percentile      Pulse      Resp      Temp      Temp src      SpO2      Weight      Height      Head Circumference      Peak Flow      Pain Score      Pain Loc      Pain Education      Exclude from Growth Chart    No data found.  Updated Vital Signs BP 122/88 (BP Location: Right Arm)   Pulse 92  Temp 98.1 F (36.7 C) (Oral)   Resp 18   SpO2 96%   Visual Acuity Right Eye Distance:   Left Eye Distance:   Bilateral Distance:    Right Eye Near:   Left Eye Near:    Bilateral Near:     Physical Exam Vitals and nursing note reviewed.  Constitutional:      General: She is not in acute distress.    Appearance: Normal appearance. She is not ill-appearing.  HENT:     Head: Normocephalic and atraumatic.  Eyes:     Conjunctiva/sclera: Conjunctivae normal.  Cardiovascular:     Rate and Rhythm: Normal rate.  Pulmonary:     Effort: Pulmonary effort is normal. No respiratory distress.  Musculoskeletal:     Comments: Mild diffuse swelling to both lateral and medial left ankle with tenderness palpation of same.  Decreased ROM of left ankle due to swelling and pain.  Skin:    Capillary Refill: Normal cap refill to left toes Neurological:     Mental Status: She is alert.     Comments: Gross sensation intact to left toes distally  Psychiatric:        Mood and Affect: Mood normal.         Behavior: Behavior normal.        Thought Content: Thought content normal.      UC Treatments / Results  Labs (all labs ordered are listed, but only abnormal results are displayed) Labs Reviewed - No data to display  EKG   Radiology DG Foot Complete Left Result Date: 03/01/2024 CLINICAL DATA:  Pain EXAM: LEFT FOOT - COMPLETE 3+ VIEW COMPARISON:  None Available. FINDINGS: There is no evidence of fracture or dislocation. There is no evidence of arthropathy or other focal bone abnormality. Soft tissue edema anterior to the ankle. IMPRESSION: No fracture or dislocation of the left foot. Soft tissue edema anterior to the ankle. Electronically Signed   By: Marolyn JONETTA Jaksch M.D.   On: 03/01/2024 14:42   DG Ankle Complete Left Result Date: 03/01/2024 CLINICAL DATA:  left ankle pain EXAM: LEFT ANKLE COMPLETE - 3+ VIEW COMPARISON:  None Available. FINDINGS: No acute fracture or dislocation. No ankle mortise widening. The talar dome is intact. There is no evidence of arthropathy or other focal bone abnormality. Soft tissue swelling about the ankle, most pronounced laterally. IMPRESSION: Soft tissue swelling about the ankle, most pronounced laterally. No acute fracture or dislocation. Electronically Signed   By: Rogelia Alivia Cimino M.D.   On: 03/01/2024 14:40    Procedures Procedures (including critical care time)  Medications Ordered in UC Medications - No data to display  Initial Impression / Assessment and Plan / UC Course  I have reviewed the triage vital signs and the nursing notes.  Pertinent labs & imaging results that were available during my care of the patient were reviewed by me and considered in my medical decision making (see chart for details).    X-ray ordered of both left ankle and left foot.  No acute fracture noted.  Suspect likely sprain or strain and advised ibuprofen , elevation, ice.  Ankle brace and crutches provided in office.  Advise follow-up with Ortho should symptoms not  improve over the next week or sooner follow-up with any worsening.  Final Clinical Impressions(s) / UC Diagnoses   Final diagnoses:  Acute left ankle pain  Left foot pain  Left ankle injury, initial encounter   Discharge Instructions   None    ED Prescriptions  None    PDMP not reviewed this encounter.   Billy Asberry FALCON, PA-C 03/01/24 1455

## 2024-03-10 ENCOUNTER — Emergency Department (HOSPITAL_COMMUNITY)
Admission: EM | Admit: 2024-03-10 | Discharge: 2024-03-10 | Disposition: A | Discharge status: Home / Self Care | Attending: Emergency Medicine | Admitting: Emergency Medicine

## 2024-03-10 ENCOUNTER — Encounter (HOSPITAL_COMMUNITY): Payer: Self-pay

## 2024-03-10 ENCOUNTER — Other Ambulatory Visit: Payer: Self-pay

## 2024-03-10 DIAGNOSIS — M25572 Pain in left ankle and joints of left foot: Secondary | ICD-10-CM | POA: Insufficient documentation

## 2024-03-10 MED ORDER — KETOROLAC TROMETHAMINE 15 MG/ML IJ SOLN
15.0000 mg | Freq: Once | INTRAMUSCULAR | Status: AC
Start: 2024-03-10 — End: 2024-03-10
  Administered 2024-03-10: 15 mg via INTRAMUSCULAR
  Filled 2024-03-10: qty 1

## 2024-03-10 NOTE — Discharge Instructions (Addendum)
 Today you were seen for left ankle pain.  Please rest, ice, compress, and elevate the affected limb.  You may alternate taking Tylenol  and Motrin  as needed for pain and swelling.  Please follow-up with orthopedics if your symptoms persist for further evaluation workup.  Thank you for letting us  treat you today. After performing a physical exam, I feel you are safe to go home. Please follow up with your PCP in the next several days and provide them with your records from this visit. Return to the Emergency Room if pain becomes severe or symptoms worsen.

## 2024-03-10 NOTE — ED Triage Notes (Signed)
 Pt c.o left ankle pain for the past 2 weeks, pt was seen at [facility] on 7/17, was told it was a sprain and given a brace. Pt taking ibuprofen  for the pain. Pt ambulatory

## 2024-03-10 NOTE — ED Provider Notes (Signed)
 [facility] Provider Note   CSN: [id number] Arrival date & time: 03/10/24  9081     Patient presents with: Ankle Pain   Dawn Sullivan is a 18 y.o. female.  Presents today for 2 weeks of left ankle pain.  Patient was seen 9 days ago at urgent care, had x-ray imaging and was told it was a sprain.  Patient was given a brace at that time.  Patient has been taking ibuprofen  for the pain.  Patient is ambulatory.  Patient denies new injury, numbness, tingling, warmth, erythema, or any other complaints at this time.    Ankle Pain      Prior to Admission medications   Medication Sig Start Date End Date Taking? Authorizing Provider  albuterol  (VENTOLIN  HFA) 108 (90 Base) MCG/ACT inhaler Inhale 2 puffs into the lungs every 4 (four) hours as needed for wheezing or shortness of breath. 01/17/24   Vonna Sharlet POUR, MD  benzonatate  (TESSALON ) 100 MG capsule Take 1 capsule (100 mg total) by mouth 3 (three) times daily as needed for cough. Patient not taking: Reported on 03/01/2024 01/17/24   Vonna Sharlet POUR, MD  cetirizine  (ZYRTEC ) 10 MG tablet Take 10 mg by mouth daily.    [provider]    Allergies: Amoxicillin    Review of Systems  Updated Vital Signs BP (!) 138/94 (BP Location: Right Arm)   Pulse 91   Temp 98.4 F (36.9 C)   Resp 18   Ht 6' 2 (1.88 m)   Wt 108.9 kg   SpO2 97%   BMI 30.81 kg/m   Physical Exam Vitals and nursing note reviewed.  Constitutional:      General: She is not in acute distress.    Appearance: Normal appearance. She is well-developed. She is not ill-appearing.  HENT:     Head: Normocephalic and atraumatic.     Right Ear: External ear normal.     Left Ear: External ear normal.     Nose: Nose normal.  Eyes:     Conjunctiva/sclera: Conjunctivae normal.  Cardiovascular:     Rate and Rhythm: Normal rate and regular rhythm.     Pulses: Normal pulses.  Pulmonary:     Effort: Pulmonary effort is  normal. No respiratory distress.  Abdominal:     Palpations: Abdomen is soft.     Tenderness: There is no abdominal tenderness.  Musculoskeletal:        General: Tenderness present. No swelling or deformity.     Cervical back: Neck supple.     Comments: Patient with minimal swelling to anterior left ankle and bilateral left malleoli with associated TTP.  Patient is neurovascularly intact.  Patient has full ROM, however this does elicit pain.  No ecchymosis or deformity noted on exam.  Skin:    General: Skin is warm and dry.     Capillary Refill: Capillary refill takes less than 2 seconds.     Findings: No bruising.  Neurological:     General: No focal deficit present.     Mental Status: She is alert and oriented to person, place, and time.     Sensory: No sensory deficit.  Psychiatric:        Mood and Affect: Mood normal.     (all labs ordered are listed, but only abnormal results are displayed) Labs Reviewed - No data to display  EKG: None  Radiology: No results found.   Procedures   Medications Ordered in the ED  ketorolac  (TORADOL ) 15 MG/ML injection 15 mg (has no administration in time range)                                    Medical Decision Making  This patient presents to the ED for concern of left ankle pain differential diagnosis includes ankle sprain, fracture, dislocation    Additional history obtained   Additional history obtained from Electronic Medical Record External records from outside source obtained and reviewed including previous ER visit   Medicines ordered and prescription drug management:  I ordered medication including Toradol     I have reviewed the patients home medicines and have made adjustments as needed   Problem List / ED Course:  Consider for admission or further workup however patient's vital signs and physical exam are reassuring.  Patient has not had any additional injury, I do not feel reimaging would be beneficial at  this time.  Patient advised to rest, ice, compress, and elevate the affected limb and continue taking Tylenol  and Motrin .  Patient to follow-up with orthopedics for further evaluation and workup if her symptoms persist.  Patient given return precautions.  I feel patient is safe for discharge at this time.       Final diagnoses:  Left ankle pain, unspecified chronicity    ED Discharge Orders     None          Francis Ileana LOISE DEVONNA 03/10/24 1003    Charlyn Sora, MD 03/11/24 [phone]

## 2024-05-11 ENCOUNTER — Ambulatory Visit

## 2024-05-13 ENCOUNTER — Ambulatory Visit (HOSPITAL_COMMUNITY): Admission: EM | Admit: 2024-05-13 | Discharge: 2024-05-13 | Disposition: A | Discharge status: Home / Self Care

## 2024-05-13 ENCOUNTER — Encounter (HOSPITAL_COMMUNITY): Payer: Self-pay

## 2024-05-13 DIAGNOSIS — N939 Abnormal uterine and vaginal bleeding, unspecified: Secondary | ICD-10-CM

## 2024-05-13 DIAGNOSIS — N926 Irregular menstruation, unspecified: Secondary | ICD-10-CM

## 2024-05-13 DIAGNOSIS — K0889 Other specified disorders of teeth and supporting structures: Secondary | ICD-10-CM

## 2024-05-13 LAB — POCT URINE PREGNANCY: Preg Test, Ur: NEGATIVE

## 2024-05-13 MED ORDER — DEXAMETHASONE SODIUM PHOSPHATE 10 MG/ML IJ SOLN
INTRAMUSCULAR | Status: AC
  Filled 2024-05-13: qty 1

## 2024-05-13 MED ORDER — KETOROLAC TROMETHAMINE 30 MG/ML IJ SOLN
30.0000 mg | Freq: Once | INTRAMUSCULAR | Status: AC
  Administered 2024-05-13: 30 mg via INTRAMUSCULAR

## 2024-05-13 MED ORDER — KETOROLAC TROMETHAMINE 30 MG/ML IJ SOLN
INTRAMUSCULAR | Status: AC
  Filled 2024-05-13: qty 1

## 2024-05-13 MED ORDER — DEXAMETHASONE SODIUM PHOSPHATE 10 MG/ML IJ SOLN
10.0000 mg | Freq: Once | INTRAMUSCULAR | Status: AC
  Administered 2024-05-13: 10 mg via INTRAMUSCULAR

## 2024-05-13 NOTE — ED Provider Notes (Signed)
 [facility]    CSN: [id number] Arrival date & time: 05/13/24  1104      History   Chief Complaint Chief Complaint  Patient presents with   Dental Pain   Vaginal Bleeding    HPI Dawn Sullivan is a 18 y.o. female.   18 year old female presents urgent care with complaints of left lower dental pain and vaginal bleeding.  In regards to the dental pain she reports that that on Tuesday she had her back molars removed on both sides.  She did not have any issues on the right however on the left after about 2 days she began develop swelling and significant pain.  She has been using ibuprofen , chlorhexidine mouthwash and has been on azithromycin.  She denies any fevers or chills.  She is having to use ice packs to help with the pain and swelling.  She also reports that the last 4 days she has had some vaginal bleeding.  She has not had a menses for months to years.  She stopped taking birth control about 4 months ago but relates that even prior to this she would have almost no vaginal bleeding with her menses.  She has had unprotected sex since then but did do a pregnancy test at home about 3 weeks ago.  This was negative.  She is not trying to get pregnant.  She denies any diarrhea, vaginal pain, vaginal itching, abnormal vaginal discharge, abdominal pain.   Dental Pain Associated symptoms: no fever   Vaginal Bleeding Associated symptoms: no abdominal pain, no back pain, no dysuria and no fever     Past Medical History:  Diagnosis Date   Headache     Patient Active Problem List   Diagnosis Date Noted   Patellar subluxation, right, subsequent encounter 08/12/2020    Past Surgical History:  Procedure Laterality Date   KNEE CARTILAGE SURGERY      OB History   No obstetric history on file.      Home Medications    Prior to Admission medications   Medication Sig Start Date End Date Taking? Authorizing Provider  azithromycin (ZITHROMAX) 250 MG tablet Take 250 mg by  mouth daily. 05/09/24 05/15/24 Yes [provider]  chlorhexidine (PERIDEX) 0.12 % solution Use as directed in the mouth or throat 2 (two) times daily. 05/09/24  Yes [provider]  cetirizine  (ZYRTEC ) 10 MG tablet Take 10 mg by mouth daily.    [provider]  ibuprofen  (ADVIL ) 800 MG tablet Take 800 mg by mouth every 8 (eight) hours as needed.    [provider]    Family History Family History  Problem Relation Age of Onset   Kidney disease Mother     Social History Social History   Tobacco Use   Smoking status: Never   Smokeless tobacco: Never  Vaping Use   Vaping status: Never Used  Substance Use Topics   Alcohol use: No   Drug use: No     Allergies   Amoxicillin   Review of Systems Review of Systems  Constitutional:  Negative for chills and fever.  HENT:  Positive for dental problem. Negative for ear pain and sore throat.   Eyes:  Negative for pain and visual disturbance.  Respiratory:  Negative for cough and shortness of breath.   Cardiovascular:  Negative for chest pain and palpitations.  Gastrointestinal:  Negative for abdominal pain and vomiting.  Genitourinary:  Positive for vaginal bleeding. Negative for dysuria and hematuria.  Musculoskeletal:  Negative for arthralgias and back pain.  Skin:  Negative for color change and rash.  Neurological:  Negative for seizures and syncope.  All other systems reviewed and are negative.    Physical Exam Triage Vital Signs ED Triage Vitals  Encounter Vitals Group     BP 05/13/24 1141 113/74     Girls Systolic BP Percentile --      Girls Diastolic BP Percentile --      Boys Systolic BP Percentile --      Boys Diastolic BP Percentile --      Pulse Rate 05/13/24 1141 73     Resp 05/13/24 1141 16     Temp 05/13/24 1141 98.5 F (36.9 C)     Temp Source 05/13/24 1141 Oral     SpO2 05/13/24 1141 98 %     Weight --      Height --      Head Circumference --      Peak Flow --       Pain Score 05/13/24 1143 4     Pain Loc --      Pain Education --      Exclude from Growth Chart --    No data found.  Updated Vital Signs BP 113/74 (BP Location: Left Arm)   Pulse 73   Temp 98.5 F (36.9 C) (Oral)   Resp 16   LMP 05/09/2024 (Exact Date)   SpO2 98%   Visual Acuity Right Eye Distance:   Left Eye Distance:   Bilateral Distance:    Right Eye Near:   Left Eye Near:    Bilateral Near:     Physical Exam Vitals and nursing note reviewed.  Constitutional:      General: She is not in acute distress.    Appearance: She is well-developed.  HENT:     Head: Normocephalic and atraumatic.      Mouth/Throat:   Eyes:     Conjunctiva/sclera: Conjunctivae normal.  Cardiovascular:     Rate and Rhythm: Normal rate and regular rhythm.     Heart sounds: No murmur heard. Pulmonary:     Effort: Pulmonary effort is normal. No respiratory distress.     Breath sounds: Normal breath sounds.  Abdominal:     Palpations: Abdomen is soft.     Tenderness: There is no abdominal tenderness.  Musculoskeletal:        General: No swelling.     Cervical back: Neck supple.  Skin:    General: Skin is warm and dry.     Capillary Refill: Capillary refill takes less than 2 seconds.  Neurological:     Mental Status: She is alert.  Psychiatric:        Mood and Affect: Mood normal.      UC Treatments / Results  Labs (all labs ordered are listed, but only abnormal results are displayed) Labs Reviewed  POCT URINE PREGNANCY    EKG   Radiology No results found.  Procedures Procedures (including critical care time)  Medications Ordered in UC Medications  ketorolac  (TORADOL ) 30 MG/ML injection 30 mg (30 mg Intramuscular Given 05/13/24 1234)  dexamethasone (DECADRON) injection 10 mg (10 mg Intramuscular Given 05/13/24 1234)    Initial Impression / Assessment and Plan / UC Course  I have reviewed the triage vital signs and the nursing notes.  Pertinent labs & imaging  results that were available during my care of the patient were reviewed by me and considered in my medical decision making (see  chart for details).     Pain, dental  Vaginal bleeding - Plan: POCT urine pregnancy, POCT urine pregnancy  Irregular menses - Plan: POCT urine pregnancy, POCT urine pregnancy   In regards to the left lower dental pain, there does not appear to be an infection present but there is a slight opening at the site.  This could still be secondary to a dry socket.  There is also some swelling and inflammation in the area.  Recommend continuing the antibiotics as prescribed as well as the chlorhexidine mouthwash.  We will treat here in clinic with the following: Decadron injection given today. This is a steroid to help with inflammation and pain. Toradol  injection given today. This is a medication to help with pain. This is not a narcotic.   May continue using ice to help with inflammation and swelling. May continue ibuprofen  for pain If no improvement in the next 1 to 2 days then recommend contacting your dentist for further evaluation May return to urgent care as needed  In regards to the vaginal bleeding, given the irregular menses we have done a urine pregnancy test today. This is negative. Recommend following up with gynecology especially given the significant irregularity of menses now off of birth control especially with the lack of menses for several years.  Final Clinical Impressions(s) / UC Diagnoses   Final diagnoses:  Pain, dental  Vaginal bleeding  Irregular menses     Discharge Instructions      In regards to the left lower dental pain, there does not appear to be an infection present but there is a slight opening at the site.  This could still be secondary to a dry socket.  There is also some swelling and inflammation in the area.  Recommend continuing the antibiotics as prescribed as well as the chlorhexidine mouthwash.  We will treat here in clinic  with the following: Decadron injection given today. This is a steroid to help with inflammation and pain. Toradol  injection given today. This is a medication to help with pain. This is not a narcotic.   May continue using ice to help with inflammation and swelling. May continue ibuprofen  for pain If no improvement in the next 1 to 2 days then recommend contacting your dentist for further evaluation May return to urgent care as needed  In regards to the vaginal bleeding, given the irregular menses we have done a urine pregnancy test today. This is negative. Recommend following up with gynecology especially given the significant irregularity of menses now off of birth control especially with the lack of menses for several years.     ED Prescriptions   None    PDMP not reviewed this encounter.   Teresa Almarie LABOR, PA-C 05/13/24 1237

## 2024-05-13 NOTE — Discharge Instructions (Addendum)
 In regards to the left lower dental pain, there does not appear to be an infection present but there is a slight opening at the site.  This could still be secondary to a dry socket.  There is also some swelling and inflammation in the area.  Recommend continuing the antibiotics as prescribed as well as the chlorhexidine mouthwash.  We will treat here in clinic with the following: Decadron injection given today. This is a steroid to help with inflammation and pain. Toradol  injection given today. This is a medication to help with pain. This is not a narcotic.   May continue using ice to help with inflammation and swelling. May continue ibuprofen  for pain If no improvement in the next 1 to 2 days then recommend contacting your dentist for further evaluation May return to urgent care as needed  In regards to the vaginal bleeding, given the irregular menses we have done a urine pregnancy test today. This is negative. Recommend following up with gynecology especially given the significant irregularity of menses now off of birth control especially with the lack of menses for several years.

## 2024-05-13 NOTE — ED Triage Notes (Signed)
 Patient here today with c/o lower left side dental pain after having tooth pulled on Tuesday. Patient is concerned with a dry socket. Patient had some swelling but the swelling went down. Patient is taking Ibuprofen  and Azithromycin and using Chlorhexidine mouth rinse with some relief.   Patient is also c/o vaginal bleeding off and on since Wednesday. Patient states that she has been off of birth control for 4 months and has not had a menstrual cycle prior to this. Patient just wanted to make sure this was normal.

## 2024-06-22 ENCOUNTER — Ambulatory Visit
Admission: EM | Admit: 2024-06-22 | Discharge: 2024-06-22 | Disposition: A | Discharge status: Home / Self Care | Attending: Family Medicine | Admitting: Family Medicine

## 2024-06-22 DIAGNOSIS — M26621 Arthralgia of right temporomandibular joint: Secondary | ICD-10-CM | POA: Insufficient documentation

## 2024-06-22 DIAGNOSIS — Z202 Contact with and (suspected) exposure to infections with a predominantly sexual mode of transmission: Secondary | ICD-10-CM | POA: Insufficient documentation

## 2024-06-22 NOTE — Discharge Instructions (Signed)
 Staff will notify you if there is anything positive on the swab. It can take 2-3 days for the tests to result, depending on the day of the week your test was taken. You will only be notified if there are any positives on the testing; test results will also go to your MyChart if you are signed up for MyChart.   Follow-up with your dentist if the pain in your jaw does not improve.

## 2024-06-22 NOTE — ED Triage Notes (Signed)
 Patient reports having right jaw pain. Wisdom were removed about 2 mos ago but still having random on/off pain radiating up to right ear. No injury.   Also, while here I would like STI testing. No symptoms just recently very sexually active.

## 2024-06-22 NOTE — ED Provider Notes (Signed)
 [facility]    CSN: [id number] Arrival date & time: 06/22/24  1622      History   Chief Complaint Chief Complaint  Patient presents with   SEXUALLY TRANSMITTED DISEASE   Jaw Pain    HPI Dawn Sullivan is a 18 y.o. female.   HPI Here for pain in her right jaw.  It pops when she opens her mouth wide and can hurt off-and-on.  She has had her wisdom teeth removed but it has not fix this pain  No fever and no drainage from her mouth  She also requests STD testing including blood work.  No symptoms currently. Of note she has a false positive that comes initially on her HIV testing and then the follow-up testing is always negative.  Last done in March. Past Medical History:  Diagnosis Date   Headache     Patient Active Problem List   Diagnosis Date Noted   Patellar subluxation, right, subsequent encounter 08/12/2020   Family history of migraine headaches 10/12/2011   Headache 07/16/2011    Past Surgical History:  Procedure Laterality Date   KNEE CARTILAGE SURGERY      OB History   No obstetric history on file.      Home Medications    Prior to Admission medications   Medication Sig Start Date End Date Taking? Authorizing Provider  acetaminophen -codeine (TYLENOL  #3) 300-30 MG tablet Take 1 tablet by mouth every 6 (six) hours as needed. 05/09/24  Yes [provider]  cetirizine  (ZYRTEC ) 10 MG tablet Take 10 mg by mouth daily.    [provider]  chlorhexidine (PERIDEX) 0.12 % solution Use as directed in the mouth or throat 2 (two) times daily. 05/09/24   [provider]  ibuprofen  (ADVIL ) 800 MG tablet Take 800 mg by mouth every 8 (eight) hours as needed.    [provider]    Family History Family History  Problem Relation Age of Onset   Kidney disease Mother     Social History Social History   Tobacco Use   Smoking status: Never   Smokeless tobacco: Never  Vaping Use   Vaping status: Never Used   Substance Use Topics   Alcohol use: No   Drug use: No     Allergies   Amoxicillin   Review of Systems Review of Systems   Physical Exam Triage Vital Signs ED Triage Vitals  Encounter Vitals Group     BP 06/22/24 1656 109/70     Girls Systolic BP Percentile --      Girls Diastolic BP Percentile --      Boys Systolic BP Percentile --      Boys Diastolic BP Percentile --      Pulse Rate 06/22/24 1656 94     Resp 06/22/24 1656 18     Temp 06/22/24 1656 98.4 F (36.9 C)     Temp Source 06/22/24 1656 Oral     SpO2 06/22/24 1656 99 %     Weight 06/22/24 1654 240 lb 1.3 oz (108.9 kg)     Height 06/22/24 1654 6' 2 (1.88 m)     Head Circumference --      Peak Flow --      Pain Score 06/22/24 1654 5     Pain Loc --      Pain Education --      Exclude from Growth Chart --    No data found.  Updated Vital Signs BP 109/70 (BP Location: Left  Arm)   Pulse 94   Temp 98.4 F (36.9 C) (Oral)   Resp 18   Ht 6' 2 (1.88 m)   Wt 108.9 kg   LMP 06/16/2022 (Exact Date) Comment: Recently came off Birth Control, so abnormal right now.  SpO2 99%   BMI 30.82 kg/m   Visual Acuity Right Eye Distance:   Left Eye Distance:   Bilateral Distance:    Right Eye Near:   Left Eye Near:    Bilateral Near:     Physical Exam Vitals reviewed.  Constitutional:      General: She is not in acute distress.    Appearance: She is not toxic-appearing.  HENT:     Mouth/Throat:     Mouth: Mucous membranes are moist.     Pharynx: No oropharyngeal exudate or posterior oropharyngeal erythema.  Eyes:     Extraocular Movements: Extraocular movements intact.     Conjunctiva/sclera: Conjunctivae normal.     Pupils: Pupils are equal, round, and reactive to light.  Cardiovascular:     Rate and Rhythm: Normal rate and regular rhythm.     Heart sounds: No murmur heard. Musculoskeletal:     Cervical back: Neck supple.  Lymphadenopathy:     Cervical: No cervical adenopathy.  Skin:     Coloration: Skin is not jaundiced or pale.  Neurological:     Mental Status: She is alert and oriented to person, place, and time.  Psychiatric:        Behavior: Behavior normal.      UC Treatments / Results  Labs (all labs ordered are listed, but only abnormal results are displayed) Labs Reviewed  HIV ANTIBODY (ROUTINE TESTING W REFLEX)  RPR  CERVICOVAGINAL ANCILLARY ONLY    EKG   Radiology No results found.  Procedures Procedures (including critical care time)  Medications Ordered in UC Medications - No data to display  Initial Impression / Assessment and Plan / UC Course  I have reviewed the triage vital signs and the nursing notes.  Pertinent labs & imaging results that were available during my care of the patient were reviewed by me and considered in my medical decision making (see chart for details).     She is given information on what to avoid to help the TMJ.  I have asked her to also talk to the dentist about possibly a bite block  Blood is drawn for HIV and RPR, and staff will notify them if any of that is positive  Vaginal self swab is done, and we will notify of any positives on that and treat per protocol.  Final Clinical Impressions(s) / UC Diagnoses   Final diagnoses:  Potential exposure to STD  Arthralgia of right temporomandibular joint     Discharge Instructions      Staff will notify you if there is anything positive on the swab. It can take 2-3 days for the tests to result, depending on the day of the week your test was taken. You will only be notified if there are any positives on the testing; test results will also go to your MyChart if you are signed up for MyChart.   Follow-up with your dentist if the pain in your jaw does not improve.       ED Prescriptions   None    PDMP not reviewed this encounter.   Vonna Sharlet POUR, MD 06/22/24 [phone]

## 2024-06-23 LAB — RPR: RPR Ser Ql: NONREACTIVE

## 2024-06-23 LAB — HIV ANTIBODY (ROUTINE TESTING W REFLEX): HIV Screen 4th Generation wRfx: NONREACTIVE

## 2024-06-24 ENCOUNTER — Emergency Department (HOSPITAL_BASED_OUTPATIENT_CLINIC_OR_DEPARTMENT_OTHER)
Admission: EM | Admit: 2024-06-24 | Discharge: 2024-06-24 | Disposition: A | Discharge status: Home / Self Care | Attending: Emergency Medicine | Admitting: Emergency Medicine

## 2024-06-24 ENCOUNTER — Other Ambulatory Visit: Payer: Self-pay

## 2024-06-24 ENCOUNTER — Emergency Department (HOSPITAL_BASED_OUTPATIENT_CLINIC_OR_DEPARTMENT_OTHER): Admitting: Radiology

## 2024-06-24 DIAGNOSIS — Y92410 Unspecified street and highway as the place of occurrence of the external cause: Secondary | ICD-10-CM | POA: Diagnosis not present

## 2024-06-24 DIAGNOSIS — M542 Cervicalgia: Secondary | ICD-10-CM | POA: Insufficient documentation

## 2024-06-24 DIAGNOSIS — R519 Headache, unspecified: Secondary | ICD-10-CM | POA: Insufficient documentation

## 2024-06-24 DIAGNOSIS — S161XXA Strain of muscle, fascia and tendon at neck level, initial encounter: Secondary | ICD-10-CM

## 2024-06-24 MED ORDER — CYCLOBENZAPRINE HCL 10 MG PO TABS: 10.0000 mg | ORAL_TABLET | Freq: Three times a day (TID) | ORAL | 0 refills | Status: AC | PRN

## 2024-06-24 NOTE — Discharge Instructions (Addendum)
 Your x-rays are negative for fracture.  It appears as though you have a neck strain.  Take ibuprofen  600 mg every 6 hours as needed for pain.  Begin taking Flexeril as prescribed as needed for pain not relieved with ibuprofen .  Rest.  Follow-up with primary doctor if not improving in the next week.
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
## 2024-06-25 LAB — CERVICOVAGINAL ANCILLARY ONLY
Chlamydia: NEGATIVE
Comment: NEGATIVE
Comment: NEGATIVE
Comment: NORMAL
Neisseria Gonorrhea: NEGATIVE
Trichomonas: NEGATIVE

## 2024-08-22 ENCOUNTER — Ambulatory Visit
Admission: EM | Admit: 2024-08-22 | Discharge: 2024-08-22 | Disposition: A | Discharge status: Home / Self Care | Attending: Family Medicine | Admitting: Family Medicine

## 2024-08-22 DIAGNOSIS — M25562 Pain in left knee: Secondary | ICD-10-CM

## 2024-08-22 DIAGNOSIS — M25362 Other instability, left knee: Secondary | ICD-10-CM | POA: Diagnosis not present

## 2024-08-22 MED ORDER — NAPROXEN 500 MG PO TABS: 500.0000 mg | ORAL_TABLET | Freq: Two times a day (BID) | ORAL | 0 refills | Status: AC

## 2024-08-22 NOTE — Discharge Instructions (Addendum)
 Follow up with an orthopedic group to pursue an MRI for your knee to rule out meniscus or ligament injury. For now use naproxen  for pain and inflammation. Use an Ace wrap during the day, not in your sleep.

## 2024-08-22 NOTE — ED Provider Notes (Signed)
 " [facility]  Note:  This document was prepared using Conservation officer, historic buildings and may include unintentional dictation errors.  MRN: [id number] DOB: 09/01/2005  Subjective:   Dawn Sullivan is a 19 y.o. female presenting for 1 week history of left knee pain, knee buckling.  No fall, trauma, redness, warmth, rash.  Has a history of a torn meniscus in the right knee.  This was likely related to her basketball activities.  Had it surgically repaired.  Currently is not participating in sports.    Current Outpatient Medications  Medication Instructions   acetaminophen -codeine (TYLENOL  #3) 300-30 MG tablet 1 tablet, Every 6 hours PRN   cetirizine  (ZYRTEC ) 10 mg, Daily   chlorhexidine (PERIDEX) 0.12 % solution 2 times daily   cyclobenzaprine  (FLEXERIL ) 10 mg, Oral, 3 times daily PRN   ibuprofen  (ADVIL ) 800 mg, Oral, Every 8 hours PRN    Allergies[1]  Past Medical History:  Diagnosis Date   Headache      Past Surgical History:  Procedure Laterality Date   KNEE CARTILAGE SURGERY      Family History  Problem Relation Age of Onset   Kidney disease Mother     Social History   Occupational History   Not on file  Tobacco Use   Smoking status: Never   Smokeless tobacco: Never  Vaping Use   Vaping status: Never Used  Substance and Sexual Activity   Alcohol use: No   Drug use: No   Sexual activity: Yes    Birth control/protection: None     ROS   Objective:   Vitals: BP 132/77 (BP Location: Right Arm)   Pulse 92   Temp 98.4 F (36.9 C) (Oral)   Resp 20   SpO2 96%   Physical Exam Constitutional:      General: She is not in acute distress.    Appearance: Normal appearance. She is well-developed. She is not ill-appearing, toxic-appearing or diaphoretic.  HENT:     Head: Normocephalic and atraumatic.     Nose: Nose normal.     Mouth/Throat:     Mouth: Mucous membranes are moist.  Eyes:     General: No scleral icterus.       Right  eye: No discharge.        Left eye: No discharge.     Extraocular Movements: Extraocular movements intact.  Cardiovascular:     Rate and Rhythm: Normal rate.  Pulmonary:     Effort: Pulmonary effort is normal.  Musculoskeletal:     Left knee: No swelling, deformity, effusion, erythema, ecchymosis, lacerations, bony tenderness or crepitus. Normal range of motion. No tenderness. Normal alignment and normal patellar mobility.  Skin:    General: Skin is warm and dry.  Neurological:     General: No focal deficit present.     Mental Status: She is alert and oriented to person, place, and time.  Psychiatric:        Mood and Affect: Mood normal.        Behavior: Behavior normal.    Applied an Ace wrap to the left knee.  Assessment and Plan :   PDMP not reviewed this encounter.  1. Acute pain of left knee   2. Knee buckling, left      Offered imaging but patient declined and I agree as it would be low yield.  Recommended RICE method, naproxen  for pain and inflammation as needed.  Follow-up with an orthopedic group for consideration of an MRI  and rule out of soft tissue derangement.  Counseled patient on potential for adverse effects with medications prescribed/recommended today, ER and return-to-clinic precautions discussed, patient verbalized understanding.     [1]  Allergies Allergen Reactions   Amoxicillin Hives and Rash    Per mom     Christopher Savannah, [place] 08/22/24 1650  "

## 2024-08-22 NOTE — ED Triage Notes (Signed)
 Pt c/o left knee pain x 1 week-states pain started after feeling like it popped in and out-no pain meds PTA-NAD-steady gait
